# Patient Record
Sex: Female | Born: 1960
Health system: Southern US, Community
[De-identification: ages and names within clinical notes are randomized; demographics above are authoritative.]

## PROBLEM LIST (undated history)

## (undated) DIAGNOSIS — T7840XA Allergy, unspecified, initial encounter: Secondary | ICD-10-CM

## (undated) HISTORY — PX: TONSILLECTOMY: SUR1361

## (undated) HISTORY — DX: Allergy, unspecified, initial encounter: T78.40XA

---

## 2012-09-14 ENCOUNTER — Ambulatory Visit (INDEPENDENT_AMBULATORY_CARE_PROVIDER_SITE_OTHER): Payer: 59 | Admitting: Emergency Medicine

## 2012-09-14 VITALS — BP 112/69 | HR 64 | Temp 97.8°F | Resp 16 | Ht 67.0 in | Wt 159.0 lb

## 2012-09-14 DIAGNOSIS — R05 Cough: Secondary | ICD-10-CM

## 2012-09-14 DIAGNOSIS — J329 Chronic sinusitis, unspecified: Secondary | ICD-10-CM

## 2012-09-14 MED ORDER — FLUTICASONE PROPIONATE 50 MCG/ACT NA SUSP: 2.0000 | Freq: Every day | NASAL | Status: DC

## 2012-09-14 MED ORDER — BENZONATATE 100 MG PO CAPS: 100.0000 mg | ORAL_CAPSULE | Freq: Three times a day (TID) | ORAL | Status: DC | PRN

## 2012-09-14 MED ORDER — HYDROCOD POLST-CHLORPHEN POLST 10-8 MG/5ML PO LQCR: 5.0000 mL | Freq: Two times a day (BID) | ORAL | Status: DC | PRN

## 2012-09-14 MED ORDER — ALBUTEROL SULFATE (2.5 MG/3ML) 0.083% IN NEBU
2.5000 mg | INHALATION_SOLUTION | Freq: Once | RESPIRATORY_TRACT | Status: AC
  Administered 2012-09-14: 2.5 mg via RESPIRATORY_TRACT

## 2012-09-14 MED ORDER — AMOXICILLIN 500 MG PO CAPS: 500.0000 mg | ORAL_CAPSULE | Freq: Three times a day (TID) | ORAL | Status: DC

## 2012-09-14 NOTE — Patient Instructions (Addendum)

## 2012-09-14 NOTE — Progress Notes (Signed)
  Subjective:    Patient ID: Megan Conner, female    DOB: 02-16-1961, 52 y.o.   MRN: [id number]  HPI Patient comes into our office with complaints of her sinuses acting up for 6 days now tried otc medicine helped a little   yellowish green mucus  HX of bronchitis Deep cough   Review of Systems  Constitutional: Positive for fatigue. Negative for fever and chills.  HENT: Positive for congestion, sneezing, postnasal drip and sinus pressure.        Chest and nasal congestion  Respiratory: Positive for cough and shortness of breath. Negative for wheezing.   Neurological: Negative for dizziness and headaches.       Objective:   Physical Exam TMs are dull. Nose is congested. Throat is slightly red. Neck is supple chest was clear to auscultation and percussion. There is tenderness over the maxillary sinuses.        Assessment & Plan:  We'll treat with amoxicillin and Flonase. For her sinuses. For her cough she will use Tessalon Perles during the day and Tussionex at night. She did not improve with nebulizer treatment so we'll not do a albuterol inhaler

## 2013-03-26 ENCOUNTER — Ambulatory Visit (INDEPENDENT_AMBULATORY_CARE_PROVIDER_SITE_OTHER): Payer: 59 | Admitting: Family Medicine

## 2013-03-26 VITALS — BP 100/60 | HR 66 | Temp 98.2°F | Resp 16 | Ht 66.5 in | Wt 167.0 lb

## 2013-03-26 DIAGNOSIS — L500 Allergic urticaria: Secondary | ICD-10-CM

## 2013-03-26 MED ORDER — METHYLPREDNISOLONE SODIUM SUCC 125 MG IJ SOLR
125.0000 mg | Freq: Once | INTRAMUSCULAR | Status: AC
  Administered 2013-03-26: 125 mg via INTRAMUSCULAR

## 2013-03-26 MED ORDER — RANITIDINE HCL 150 MG PO TABS: 150.0000 mg | ORAL_TABLET | Freq: Two times a day (BID) | ORAL | Status: DC

## 2013-03-26 MED ORDER — PREDNISONE 20 MG PO TABS: 20.0000 mg | ORAL_TABLET | Freq: Every day | ORAL | Status: DC

## 2013-03-26 NOTE — Progress Notes (Signed)
Subjective: On Monday the patient broke out with times on her arms and buttocks. It subsided, but she's broken out again today, 3 days later. She does not have a history of hives. She does not know for certain he thinks she is exposed to that would've caused these except for she had a cup of Chi on both days. No oral lesions or respiratory compromise.  Objective: Pleasant lady in no major distress except she is scratching. Chest clear. Throat clear. Neck supple nontender. She has a rash on her upper arms, is scratching at her hands, and her flanks have hives on both sides. Down toward the buttocks.  Assessment: Urged.  Plan: Zyrtec Zantac Solu-Medrol 125 IM Continue the Zyrtec and Zantac at home and take tapered dose of prednisone.

## 2013-03-26 NOTE — Patient Instructions (Addendum)
Take ranitidine (Zantac) 150 mg twice daily  Take Zyrtec 10 mg(cetirizine) and milligrams once or twice daily  Take prednisone in tapered dose, 3 daily for 2 days, then 2 daily for 2 days, then one daily for 2 days  If any concern of respiratory involvement get checked properly. Got to the emergency room if necessary or call 911.  If continued recurrences please return  Hives Hives are itchy, red, swollen areas of the skin. They can vary in size and location on your body. Hives can come and go for hours or several days (acute hives) or for several weeks (chronic hives). Hives do not spread from person to person (noncontagious). They may get worse with scratching, exercise, and emotional stress. CAUSES   Allergic reaction to food, additives, or drugs.  Infections, including the common cold.  Illness, such as vasculitis, lupus, or thyroid disease.  Exposure to sunlight, heat, or cold.  Exercise.  Stress.  Contact with chemicals. SYMPTOMS   Red or white swollen patches on the skin. The patches may change size, shape, and location quickly and repeatedly.  Itching.  Swelling of the hands, feet, and face. This may occur if hives develop deeper in the skin. DIAGNOSIS  Your caregiver can usually tell what is wrong by performing a physical exam. Skin or blood tests may also be done to determine the cause of your hives. In some cases, the cause cannot be determined. TREATMENT  Mild cases usually get better with medicines such as antihistamines. Severe cases may require an emergency epinephrine injection. If the cause of your hives is known, treatment includes avoiding that trigger.  HOME CARE INSTRUCTIONS   Avoid causes that trigger your hives.  Take antihistamines as directed by your caregiver to reduce the severity of your hives. Non-sedating or low-sedating antihistamines are usually recommended. Do not drive while taking an antihistamine.  Take any other medicines prescribed for  itching as directed by your caregiver.  Wear loose-fitting clothing.  Keep all follow-up appointments as directed by your caregiver. SEEK MEDICAL CARE IF:   You have persistent or severe itching that is not relieved with medicine.  You have painful or swollen joints. SEEK IMMEDIATE MEDICAL CARE IF:   You have a fever.  Your tongue or lips are swollen.  You have trouble breathing or swallowing.  You feel tightness in the throat or chest.  You have abdominal pain. These problems may be the first sign of a life-threatening allergic reaction. Call your local emergency services (911 in [place] MAKE SURE YOU:   Understand these instructions.  Will watch your condition.  Will get help right away if you are not doing well or get worse. Document Released: 03/13/2005 Document Revised: 09/12/2011 Document Reviewed: 06/06/2011 [facility] Patient Information 2014 [place], [place].

## 2013-04-07 ENCOUNTER — Other Ambulatory Visit: Payer: Self-pay | Admitting: Family Medicine

## 2013-04-09 NOTE — Telephone Encounter (Signed)
Dr Alwyn RenHopper, do you want to RF for allergic urticaria or RTC?

## 2013-04-11 NOTE — Telephone Encounter (Signed)
Advised husband (on HIPPA) that RF done, but if still symptomatic pt should RTC. He stated that she doesn't have sxs every day, but if she is still having sxs after this RF he will have her RTC.

## 2013-04-11 NOTE — Telephone Encounter (Signed)
Call patient: Will refill, but is still having symptoms should return

## 2013-04-24 ENCOUNTER — Ambulatory Visit (INDEPENDENT_AMBULATORY_CARE_PROVIDER_SITE_OTHER): Payer: 59 | Admitting: Emergency Medicine

## 2013-04-24 VITALS — BP 106/72 | HR 78 | Temp 98.1°F | Resp 16 | Ht 65.75 in | Wt 165.0 lb

## 2013-04-24 DIAGNOSIS — R05 Cough: Secondary | ICD-10-CM

## 2013-04-24 DIAGNOSIS — R059 Cough, unspecified: Secondary | ICD-10-CM

## 2013-04-24 DIAGNOSIS — J019 Acute sinusitis, unspecified: Secondary | ICD-10-CM

## 2013-04-24 MED ORDER — BENZONATATE 100 MG PO CAPS: 100.0000 mg | ORAL_CAPSULE | Freq: Three times a day (TID) | ORAL | Status: DC | PRN

## 2013-04-24 MED ORDER — AMOXICILLIN 875 MG PO TABS: 875.0000 mg | ORAL_TABLET | Freq: Two times a day (BID) | ORAL | Status: DC

## 2013-04-24 NOTE — Patient Instructions (Signed)
Cough, Adult  A cough is a reflex that helps clear your throat and airways. It can help heal the body or may be a reaction to an irritated airway. A cough may only last 2 or 3 weeks (acute) or may last more than 8 weeks (chronic).  CAUSES Acute cough:  Viral or bacterial infections. Chronic cough:  Infections.  Allergies.  Asthma.  Post-nasal drip.  Smoking.  Heartburn or acid reflux.  Some medicines.  Chronic lung problems (COPD).  Cancer. SYMPTOMS   Cough.  Fever.  Chest pain.  Increased breathing rate.  High-pitched whistling sound when breathing (wheezing).  Colored mucus that you cough up (sputum). TREATMENT   A bacterial cough may be treated with antibiotic medicine.  A viral cough must run its course and will not respond to antibiotics.  Your caregiver may recommend other treatments if you have a chronic cough. HOME CARE INSTRUCTIONS   Only take over-the-counter or prescription medicines for pain, discomfort, or fever as directed by your caregiver. Use cough suppressants only as directed by your caregiver.  Use a cold steam vaporizer or humidifier in your bedroom or home to help loosen secretions.  Sleep in a semi-upright position if your cough is worse at night.  Rest as needed.  Stop smoking if you smoke. SEEK IMMEDIATE MEDICAL CARE IF:   You have pus in your sputum.  Your cough starts to worsen.  You cannot control your cough with suppressants and are losing sleep.  You begin coughing up blood.  You have difficulty breathing.  You develop pain which is getting worse or is uncontrolled with medicine.  You have a fever. MAKE SURE YOU:   Understand these instructions.  Will watch your condition.  Will get help right away if you are not doing well or get worse. Document Released: 09/09/2010 Document Revised: 06/05/2011 Document Reviewed: 09/09/2010 [employer] Patient Information 2014 [employer]. Sinusitis Sinusitis is redness,  soreness, and swelling (inflammation) of the paranasal sinuses. Paranasal sinuses are air pockets within the bones of your face (beneath the eyes, the middle of the forehead, or above the eyes). In healthy paranasal sinuses, mucus is able to drain out, and air is able to circulate through them by way of your nose. However, when your paranasal sinuses are inflamed, mucus and air can become trapped. This can allow bacteria and other germs to grow and cause infection. Sinusitis can develop quickly and last only a short time (acute) or continue over a long period (chronic). Sinusitis that lasts for more than 12 weeks is considered chronic.  CAUSES  Causes of sinusitis include:  Allergies.  Structural abnormalities, such as displacement of the cartilage that separates your nostrils (deviated septum), which can decrease the air flow through your nose and sinuses and affect sinus drainage.  Functional abnormalities, such as when the small hairs (cilia) that line your sinuses and help remove mucus do not work properly or are not present. SYMPTOMS  Symptoms of acute and chronic sinusitis are the same. The primary symptoms are pain and pressure around the affected sinuses. Other symptoms include:  Upper toothache.  Earache.  Headache.  Bad breath.  Decreased sense of smell and taste.  A cough, which worsens when you are lying flat.  Fatigue.  Fever.  Thick drainage from your nose, which often is green and may contain pus (purulent).  Swelling and warmth over the affected sinuses. DIAGNOSIS  Your caregiver will perform a physical exam. During the exam, your caregiver may:  Look in   your nose for signs of abnormal growths in your nostrils (nasal polyps).  Tap over the affected sinus to check for signs of infection.  View the inside of your sinuses (endoscopy) with a special imaging device with a light attached (endoscope), which is inserted into your sinuses. If your caregiver suspects  that you have chronic sinusitis, one or more of the following tests may be recommended:  Allergy tests.  Nasal culture A sample of mucus is taken from your nose and sent to a lab and screened for bacteria.  Nasal cytology A sample of mucus is taken from your nose and examined by your caregiver to determine if your sinusitis is related to an allergy. TREATMENT  Most cases of acute sinusitis are related to a viral infection and will resolve on their own within 10 days. Sometimes medicines are prescribed to help relieve symptoms (pain medicine, decongestants, nasal steroid sprays, or saline sprays).  However, for sinusitis related to a bacterial infection, your caregiver will prescribe antibiotic medicines. These are medicines that will help kill the bacteria causing the infection.  Rarely, sinusitis is caused by a fungal infection. In theses cases, your caregiver will prescribe antifungal medicine. For some cases of chronic sinusitis, surgery is needed. Generally, these are cases in which sinusitis recurs more than 3 times per year, despite other treatments. HOME CARE INSTRUCTIONS   Drink plenty of water. Water helps thin the mucus so your sinuses can drain more easily.  Use a humidifier.  Inhale steam 3 to 4 times a day (for example, sit in the bathroom with the shower running).  Apply a warm, moist washcloth to your face 3 to 4 times a day, or as directed by your caregiver.  Use saline nasal sprays to help moisten and clean your sinuses.  Take over-the-counter or prescription medicines for pain, discomfort, or fever only as directed by your caregiver. SEEK IMMEDIATE MEDICAL CARE IF:  You have increasing pain or severe headaches.  You have nausea, vomiting, or drowsiness.  You have swelling around your face.  You have vision problems.  You have a stiff neck.  You have difficulty breathing. MAKE SURE YOU:   Understand these instructions.  Will watch your condition.  Will get  help right away if you are not doing well or get worse. Document Released: 03/13/2005 Document Revised: 06/05/2011 Document Reviewed: 03/28/2011 [employer] Patient Information 2014 [employer].  

## 2013-04-24 NOTE — Progress Notes (Signed)
Subjective:    Patient ID: Megan Conner, female    DOB: 24-Jan-1961, 53 y.o.   MRN: [id number] This chart was scribed for Collene GobbleSteven A Gurvir Schrom, MD by Valera CastleSteven Perry, ED Scribe. This patient was seen in room 13 and the patient's care was started at 9:27 AM.  Chief Complaint  Patient presents with  . Sinus Congestion    X 1 week    HPI Megan Conner is a 53 y.o. female She presents with congestion and cough, productive of dark yellow/clear sputum, onset 1 week ago. She states her symptoms started with a sore throat. She also reports post-nasal drip. She reports at night when she lays down she can feel her ears plug up. She reports taking Flonase in the morning, with temporary relief. She denies being around sick contacts at work. She denies fever, body aches, sinus pressure/pain, and any other associated symptoms. She reports a h/o tonsillectomy. She denies h/o smoking. She reports Codeine causes her nausea, but denies any true allergic reaction.   PCP - GUEST, Loretha StaplerHRIS WARREN, MD  There are no active problems to display for this patient.  Past Medical History  Diagnosis Date  . Allergy    Past Surgical History  Procedure Laterality Date  . Tonsillectomy     Allergies  Allergen Reactions  . Codeine Nausea Only   Prior to Admission medications   Medication Sig Start Date End Date Taking? Authorizing Provider  fluticasone (FLONASE) 50 MCG/ACT nasal spray Place 2 sprays into the nose daily. 09/14/12  Yes Collene GobbleSteven A Tiffeny Minchew, MD  guaiFENesin (MUCINEX) 600 MG 12 hr tablet Take by mouth 2 (two) times daily.   Yes Historical Provider, MD  diphenhydrAMINE (BENADRYL) 25 MG tablet Take 25 mg by mouth every 6 (six) hours as needed.    Historical Provider, MD  predniSONE (DELTASONE) 20 MG tablet Take 1 tablet (20 mg total) by mouth daily with breakfast. 03/26/13   Peyton Najjaravid H Hopper, MD  ranitidine (ZANTAC) 150 MG tablet TAKE 1 TABLET (150 MG TOTAL) BY MOUTH 2 (TWO) TIMES DAILY. 04/07/13   Peyton Najjaravid H Hopper, MD     Review of Systems  Constitutional: Negative for fever.  HENT: Positive for congestion, postnasal drip and sore throat. Negative for ear pain and sinus pressure.   Respiratory: Positive for cough.   Musculoskeletal: Negative for arthralgias and myalgias.      Objective:   Physical Exam Nursing note and vitals reviewed. Constitutional: Pt is oriented to person, place, and time. Pt appears well-developed and well-nourished. No distress.  HENT: Significant nasal congestion. Purulent draining from right side of nose.   Head: Normocephalic and atraumatic.  Eyes: EOM are normal. Pupils are equal, round, and reactive to light.  Neck: Neck supple. No tracheal deviation present.  Cardiovascular: Normal rate, regular rhythm and normal heart sounds.  Exam reveals no gallop and no friction rub. No murmur heard. Pulmonary/Chest: Effort normal and breath sounds normal. No respiratory distress. Pt has no wheezes. Pt has no rales.  Abdominal: Musculoskeletal: Normal range of motion.  Neurological: Pt is alert and oriented to person, place, and time.  Skin: Skin is warm and dry.  Psychiatric: Pt has a normal mood and affect. Pt's behavior is normal.   BP 106/72  Pulse 78  Temp(Src) 98.1 F (36.7 C)  Resp 16  Ht 5' 5.75" (1.67 m)  Wt 165 lb (74.844 kg)  BMI 26.84 kg/m2  SpO2 97%     Assessment & Plan:   Patient has  a physical exam consistent with acute sinusitis. She also has a cough probably secondary to postnasal drip. We'll treat with amoxicillin and Tessalon Perles . She was 5 days into the illness so I did not do a flu swab

## 2013-10-02 ENCOUNTER — Other Ambulatory Visit: Payer: Self-pay | Admitting: Emergency Medicine

## 2016-10-14 ENCOUNTER — Ambulatory Visit (INDEPENDENT_AMBULATORY_CARE_PROVIDER_SITE_OTHER): Payer: 59

## 2016-10-14 ENCOUNTER — Encounter: Payer: Self-pay | Admitting: Physician Assistant

## 2016-10-14 ENCOUNTER — Ambulatory Visit (INDEPENDENT_AMBULATORY_CARE_PROVIDER_SITE_OTHER): Payer: 59 | Admitting: Physician Assistant

## 2016-10-14 VITALS — BP 114/76 | HR 70 | Temp 97.8°F | Resp 18 | Ht 66.69 in | Wt 183.0 lb

## 2016-10-14 DIAGNOSIS — G8929 Other chronic pain: Secondary | ICD-10-CM | POA: Diagnosis not present

## 2016-10-14 DIAGNOSIS — M25512 Pain in left shoulder: Secondary | ICD-10-CM | POA: Diagnosis not present

## 2016-10-14 DIAGNOSIS — G5601 Carpal tunnel syndrome, right upper limb: Secondary | ICD-10-CM | POA: Diagnosis not present

## 2016-10-14 LAB — GLUCOSE, POCT (MANUAL RESULT ENTRY): POC Glucose: 76 mg/dl (ref 70–99)

## 2016-10-14 MED ORDER — PREDNISONE 10 MG PO TABS: ORAL_TABLET | ORAL | 0 refills | Status: DC

## 2016-10-14 NOTE — Progress Notes (Signed)
Megan ShackletonVicky Conner Febus  MRN: [id number] DOB: 11/20/1960  Subjective:  Megan ShackletonVicky Conner Aoun is a 56 y.o. female who presents with left shoulder pain. The symptoms began one month ago. Aggravating factors: she works as a IT sales professionalfirefighter and uses it daily but cannot identify an initial event. Is constantly lifting heavy objects and carrying heavy packs. Pain is located around the acromioclavicular [facility](AC) joint. Discomfort is described as aching. Symptoms are exacerbated by repetitive movements, overhead movements and lying on the shoulder. Evaluation to date: none. Therapy to date includes: deep blue with moderate temporary relief.   Also have numbness/tingling in first three fingers of right hand x 2 months. It is most noticeable at night and when blow drying or typing at work. Denies acute injury, pain, and dropping objects.  Works part time in a job where she types a lot. 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 Has not tried anything for relief.  Review of Systems  Constitutional: Negative for chills, diaphoresis and fever.  Musculoskeletal: Negative for back pain, myalgias and neck pain.  Neurological: Negative for dizziness, facial asymmetry, speech difficulty, weakness, light-headedness and headaches.    There are no active problems to display for this patient.   Current Outpatient Prescriptions on File Prior to Visit  Medication Sig Dispense Refill  . fluticasone (FLONASE) 50 MCG/ACT nasal spray INHALE 2 SPRAYS INTO EACH NOSTRIL DAILY. 16 g 1  . amoxicillin (AMOXIL) 875 MG tablet Take 1 tablet (875 mg total) by mouth 2 (two) times daily. (Patient not taking: Reported on 10/14/2016) 20 tablet 0  . benzonatate (TESSALON) 100 MG capsule Take 1-2 capsules (100-200 mg total) by mouth 3 (three) times daily as needed for cough. (Patient not taking: Reported on 10/14/2016) 40 capsule 0  . diphenhydrAMINE (BENADRYL) 25 MG tablet Take 25 mg by mouth every 6 (six) hours as needed.      Marland Kitchen. guaiFENesin (MUCINEX) 600 MG 12 hr tablet Take by mouth 2 (two) times daily.    . ranitidine (ZANTAC) 150 MG tablet TAKE 1 TABLET (150 MG TOTAL) BY MOUTH 2 (TWO) TIMES DAILY. (Patient not taking: Reported on 10/14/2016) 30 tablet 0   No current facility-administered medications on file prior to visit.     Allergies  Allergen Reactions  . Codeine Nausea Only      Social History   Social History  . Marital status: Married    Spouse name: N/A  . Number of children: N/A  . Years of education: N/A   Occupational History  . Not on file.   Social History Main Topics  . Smoking status: Never Smoker  . Smokeless tobacco: Never Used  . Alcohol use 0.6 oz/week    1 Glasses of wine per week     Comment: social  . Drug use: Unknown  . Sexual activity: Yes   Other Topics Concern  . Not on file   Social History Narrative  . No narrative on file    Objective:  BP 114/76 (BP Location: Right Arm, Patient Position: Sitting, Cuff Size: Normal)   Pulse 70   Temp 97.8 F (36.6 C) (Oral)   Resp 18   Ht 5' 6.69" (1.694 m)   Wt 183 lb (83 kg)   SpO2 98%   BMI 28.93 kg/m   Physical Exam  Constitutional: She is oriented to person, place, and time and well-developed, well-nourished, and in no distress.  HENT:  Head: Normocephalic and atraumatic.  Eyes:  Conjunctivae are normal.  Neck: Normal range of motion.  Pulmonary/Chest: Effort normal.  Musculoskeletal:       Right shoulder: Normal.       Left shoulder: She exhibits tenderness (mild with palpation of AC joint  ) and crepitus ( with ROM exercises ). She exhibits normal range of motion, no bony tenderness, no swelling, normal pulse and normal strength.       Right wrist: She exhibits normal range of motion and no tenderness.       Cervical back: Normal.       Right hand: She exhibits normal capillary refill.  Positive Phalen's sign in right hand Negative Tinel's test of right hand  Negative Neer, Hawkins, and Drop Arm Test  of left shoulder.   Neurological: She is alert and oriented to person, place, and time. She has normal sensation. Gait normal.  Reflex Scores:      Tricep reflexes are 2+ on the right side and 2+ on the left side.      Bicep reflexes are 2+ on the right side and 2+ on the left side.      Brachioradialis reflexes are 2+ on the right side and 2+ on the left side. Skin: Skin is warm and dry.  Psychiatric: Affect normal.  Vitals reviewed.  Dg Shoulder Left  Result Date: 10/14/2016 CLINICAL DATA:  Left shoulder pain. EXAM: LEFT SHOULDER - 2+ VIEW COMPARISON:  None. FINDINGS: There is no evidence of fracture or dislocation. Minimal proliferative disease involving the distal acromion. The [facility] joint shows normal alignment. The glenohumeral joint shows no degenerative disease. No bony lesions identified. Soft tissues are unremarkable. IMPRESSION: Mild proliferative disease involving the left acromion. Electronically Signed   By: Irish Lack M.D.   On: 10/14/2016 10:11   Results for orders placed or performed in visit on 10/14/16 (from the past 24 hour(s))  POCT glucose (manual entry)     Status: None   Collection Time: 10/14/16 10:14 AM  Result Value Ref Range   POC Glucose 76 70 - 99 mg/dl   Assessment and Plan :  1. Chronic left shoulder pain Mild spurring of acromion noted on plain films. Pt encouraged to rest, ice, and perform exercises given today in office. Pt is going to be placed on prednisone for symptoms of carpal tunnel syndrome, which will also help with any underlying left shoulder inflammation. Pt instructed to return to clinic if symptoms worsen, do not improve, or as needed. Consider orthopedic referral if symptoms worsen or do not improve with current tx.  - DG Shoulder Left; Future - POCT glucose (manual entry)  2. Carpal tunnel syndrome of right wrist Hx and PE findings consistent with carpal tunnel syndrome. Will tx conservatively at this time. Given educational material on  carpal tunnel syndrome. Educated on common side effects of prednisone. Instructed to return to clinic if symptoms worsen, do not improve, or as needed. Consider neurology referral if symptoms worsen or do not improve with current tx.  - Splint wrist - predniSONE (DELTASONE) 10 MG tablet; Days 1-7: Take 2 tablets in the am with food. Days 7-14: Take 1 tablet in the am with food.  Dispense: 21 tablet; Refill: 0  Benjiman Core, PA-C  [facility] at [facility] 10/14/2016 5:45 PM

## 2016-10-14 NOTE — Patient Instructions (Addendum)
We are going to treat your underlying inflammation with oral prednisone. Prednisone is a steroid and can cause side effects such as headache, irritability, nausea, vomiting, increased heart rate, increased blood pressure, increased blood sugar, appetite changes, and insomnia. Please take tablets in the morning with a full meal to help decrease the chances of these side effects.    For shoulder pain, make sure you also ice and rest the area as much as you can in your free time. I will give you some shoulder exercises to do below.   For carpal tunnel, wear the night splint every night and avoid repetitive motions where you can.   For either issue, please return for follow up symptoms worsen, do not improve, or as needed    Carpal Tunnel Syndrome Carpal tunnel syndrome is a condition that causes pain in your hand and arm. The carpal tunnel is a narrow area that is on the palm side of your wrist. Repeated wrist motion or certain diseases may cause swelling in the tunnel. This swelling can pinch the main nerve in the wrist (median nerve). Follow these instructions at home: If you have a splint:  Wear it as told by your doctor. Remove it only as told by your doctor.  Loosen the splint if your fingers: ? Become numb and tingle. ? Turn blue and cold.  Keep the splint clean and dry. General instructions  Take over-the-counter and prescription medicines only as told by your doctor.  Rest your wrist from any activity that may be causing your pain. If needed, talk to your employer about changes that can be made in your work, such as getting a wrist pad to use while typing.  If directed, apply ice to the painful area: ? Put ice in a plastic bag. ? Place a towel between your skin and the bag. ? Leave the ice on for 20 minutes, 2-3 times per day.  Keep all follow-up visits as told by your doctor. This is important.  Do any exercises as told by your doctor, physical therapist, or occupational  therapist. Contact a doctor if:  You have new symptoms.  Medicine does not help your pain.  Your symptoms get worse. This information is not intended to replace advice given to you by your health care provider. Make sure you discuss any questions you have with your health care provider. Document Released: 03/02/2011 Document Revised: 08/19/2015 Document Reviewed: 07/29/2014 Elsevier Interactive Patient Education  2018 [employer]    Shoulder Impingement Syndrome Shoulder impingement syndrome is a condition that causes pain when connective tissues (tendons) surrounding the shoulder joint become pinched. These tendons are part of the group of muscles and tissues that help to stabilize the shoulder (rotator cuff). Beneath the rotator cuff is a fluid-filled sac (bursa) that allows the muscles and tendons to glide smoothly. The bursa may become swollen or irritated (bursitis). Bursitis, swelling in the rotator cuff tendons, or both conditions can decrease how much space is under a bone in the shoulder joint (acromion), resulting in impingement. What are the causes? Shoulder impingement syndrome can be caused by bursitis or swelling of the rotator cuff tendons, which may result from:  Repetitive overhead arm movements.  Falling onto the shoulder.  Weakness in the shoulder muscles.  What increases the risk? You may be more likely to develop this condition if you are an athlete who participates in:  Sports that involve throwing, such as baseball.  Tennis.  Swimming.  Volleyball.  Some people are also  more likely to develop impingement syndrome because of the shape of their acromion bone. What are the signs or symptoms? The main symptom of this condition is pain on the front or side of the shoulder. Pain may:  Get worse when lifting or raising the arm.  Get worse at night.  Wake you up from sleeping.  Feel sharp when the shoulder is moved, and then fade to an ache.  Other  signs and symptoms may include:  Tenderness.  Stiffness.  Inability to raise the arm above shoulder level or behind the body.  Weakness.  How is this diagnosed? This condition may be diagnosed based on:  Your symptoms.  Your medical history.  A physical exam.  Imaging tests, such as: ? X-rays. ? MRI. ? Ultrasound.  How is this treated? Treatment for this condition may include:  Resting your shoulder and avoiding all activities that cause pain or put stress on the shoulder.  Icing your shoulder.  NSAIDs to help reduce pain and swelling.  One or more injections of medicines to numb the area and reduce inflammation.  Physical therapy.  Surgery. This may be needed if nonsurgical treatments have not helped. Surgery may involve repairing the rotator cuff, reshaping the acromion, or removing the bursa.  Follow these instructions at home: Managing pain, stiffness, and swelling  If directed, apply ice to the injured area. ? Put ice in a plastic bag. ? Place a towel between your skin and the bag. ? Leave the ice on for 20 minutes, 2-3 times a day. Activity  Rest and return to your normal activities as told by your health care provider. Ask your health care provider what activities are safe for you.  Do exercises as told by your health care provider. General instructions  Do not use any tobacco products, including cigarettes, chewing tobacco, or e-cigarettes. Tobacco can delay healing. If you need help quitting, ask your health care provider.  Ask your health care provider when it is safe for you to drive.  Take over-the-counter and prescription medicines only as told by your health care provider.  Keep all follow-up visits as told by your health care provider. This is important. How is this prevented?  Give your body time to rest between periods of activity.  Be safe and responsible while being active to avoid falls.  Maintain physical fitness, including  strength and flexibility. Contact a health care provider if:  Your symptoms have not improved after 1-2 months of treatment and rest.  You cannot lift your arm away from your body. This information is not intended to replace advice given to you by your health care provider. Make sure you discuss any questions you have with your health care provider. Document Released: 03/13/2005 Document Revised: 11/18/2015 Document Reviewed: 02/13/2015 Elsevier Interactive Patient Education  2018 [employer]  Shoulder Impingement Syndrome Rehab Ask your health care provider which exercises are safe for you. Do exercises exactly as told by your health care provider and adjust them as directed. It is normal to feel mild stretching, pulling, tightness, or discomfort as you do these exercises, but you should stop right away if you feel sudden pain or your pain gets worse.Do not begin these exercises until told by your health care provider. Stretching and range of motion exercise This exercise warms up your muscles and joints and improves the movement and flexibility of your shoulder. This exercise also helps to relieve pain and stiffness. Exercise A: Passive horizontal adduction  1. Sit or stand  and pull your left / right elbow across your chest, toward your other shoulder. Stop when you feel a gentle stretch in the back of your shoulder and upper arm. ? Keep your arm at shoulder height. ? Keep your arm as close to your body as you comfortably can. 2. Hold for __________ seconds. 3. Slowly return to the starting position. Repeat __________ times. Complete this exercise __________ times a day. Strengthening exercises These exercises build strength and endurance in your shoulder. Endurance is the ability to use your muscles for a long time, even after they get tired. Exercise B: External rotation, isometric 1. Stand or sit in a doorway, facing the door frame. 2. Bend your left / right elbow and place the  back of your wrist against the door frame. Only your wrist should be touching the frame. Keep your upper arm at your side. 3. Gently press your wrist against the door frame, as if you are trying to push your arm away from your abdomen. ? Avoid shrugging your shoulder while you press your hand against the door frame. Keep your shoulder blade tucked down toward the middle of your back. 4. Hold for __________ seconds. 5. Slowly release the tension, and relax your muscles completely before you do the exercise again. Repeat __________ times. Complete this exercise __________ times a day. Exercise C: Internal rotation, isometric  1. Stand or sit in a doorway, facing the door frame. 2. Bend your left / right elbow and place the inside of your wrist against the door frame. Only your wrist should be touching the frame. Keep your upper arm at your side. 3. Gently press your wrist against the door frame, as if you are trying to push your arm toward your abdomen. ? Avoid shrugging your shoulder while you press your hand against the door frame. Keep your shoulder blade tucked down toward the middle of your back. 4. Hold for __________ seconds. 5. Slowly release the tension, and relax your muscles completely before you do the exercise again. Repeat __________ times. Complete this exercise __________ times a day. Exercise D: Scapular protraction, supine  1. Lie on your back on a firm surface. Hold a __________ weight in your left / right hand. 2. Raise your left / right arm straight into the air so your hand is directly above your shoulder joint. 3. Push the weight into the air so your shoulder lifts off of the surface that you are lying on. Do not move your head, neck, or back. 4. Hold for __________ seconds. 5. Slowly return to the starting position. Let your muscles relax completely before you repeat this exercise. Repeat __________ times. Complete this exercise __________ times a day. Exercise E:  Scapular retraction  1. Sit in a stable chair without armrests, or stand. 2. Secure an exercise band to a stable object in front of you so the band is at shoulder height. 3. Hold one end of the exercise band in each hand. Your palms should face down. 4. Squeeze your shoulder blades together and move your elbows slightly behind you. Do not shrug your shoulders while you do this. 5. Hold for __________ seconds. 6. Slowly return to the starting position. Repeat __________ times. Complete this exercise __________ times a day. Exercise F: Shoulder extension  1. Sit in a stable chair without armrests, or stand. 2. Secure an exercise band to a stable object in front of you where the band is above shoulder height. 3. Hold one end of the exercise  band in each hand. 4. Straighten your elbows and lift your hands up to shoulder height. 5. Squeeze your shoulder blades together and pull your hands down to the sides of your thighs. Stop when your hands are straight down by your sides. Do not let your hands go behind your body. 6. Hold for __________ seconds. 7. Slowly return to the starting position. Repeat __________ times. Complete this exercise __________ times a day. This information is not intended to replace advice given to you by your health care provider. Make sure you discuss any questions you have with your health care provider. Document Released: 03/13/2005 Document Revised: 11/18/2015 Document Reviewed: 02/13/2015 Elsevier Interactive Patient Education  2018 [employer]. Shoulder Exercises Ask your health care provider which exercises are safe for you. Do exercises exactly as told by your health care provider and adjust them as directed. It is normal to feel mild stretching, pulling, tightness, or discomfort as you do these exercises, but you should stop right away if you feel sudden pain or your pain gets worse.Do not begin these exercises until told by your health care provider. RANGE OF  MOTION EXERCISES These exercises warm up your muscles and joints and improve the movement and flexibility of your shoulder. These exercises also help to relieve pain, numbness, and tingling. These exercises involve stretching your injured shoulder directly. Exercise A: Pendulum  4. Stand near a wall or a surface that you can hold onto for balance. 5. Bend at the waist and let your left / right arm hang straight down. Use your other arm to support you. Keep your back straight and do not lock your knees. 6. Relax your left / right arm and shoulder muscles, and move your hips and your trunk so your left / right arm swings freely. Your arm should swing because of the motion of your body, not because you are using your arm or shoulder muscles. 7. Keep moving your body so your arm swings in the following directions, as told by your health care provider: ? Side to side. ? Forward and backward. ? In clockwise and counterclockwise circles. 8. Continue each motion for __________ seconds, or for as long as told by your health care provider. 9. Slowly return to the starting position. Repeat __________ times. Complete this exercise __________ times a day. Exercise B:Flexion, Standing  6. Stand and hold a broomstick, a cane, or a similar object. Place your hands a little more than shoulder-width apart on the object. Your left / right hand should be palm-up, and your other hand should be palm-down. 7. Keep your elbow straight and keep your shoulder muscles relaxed. Push the stick down with your healthy arm to raise your left / right arm in front of your body, and then over your head until you feel a stretch in your shoulder. ? Avoid shrugging your shoulder while you raise your arm. Keep your shoulder blade tucked down toward the middle of your back. 8. Hold for __________ seconds. 9. Slowly return to the starting position. Repeat __________ times. Complete this exercise __________ times a day. Exercise C:  Abduction, Standing 6. Stand and hold a broomstick, a cane, or a similar object. Place your hands a little more than shoulder-width apart on the object. Your left / right hand should be palm-up, and your other hand should be palm-down. 7. While keeping your elbow straight and your shoulder muscles relaxed, push the stick across your body toward your left / right side. Raise your left / right  arm to the side of your body and then over your head until you feel a stretch in your shoulder. ? Do not raise your arm above shoulder height, unless your health care provider tells you to do that. ? Avoid shrugging your shoulder while you raise your arm. Keep your shoulder blade tucked down toward the middle of your back. 8. Hold for __________ seconds. 9. Slowly return to the starting position. Repeat __________ times. Complete this exercise __________ times a day. Exercise D:Internal Rotation  1. Place your left / right hand behind your back, palm-up. 2. Use your other hand to dangle an exercise band, a towel, or a similar object over your shoulder. Grasp the band with your left / right hand so you are holding onto both ends. 3. Gently pull up on the band until you feel a stretch in the front of your left / right shoulder. ? Avoid shrugging your shoulder while you raise your arm. Keep your shoulder blade tucked down toward the middle of your back. 4. Hold for __________ seconds. 5. Release the stretch by letting go of the band and lowering your hands. Repeat __________ times. Complete this exercise __________ times a day. STRETCHING EXERCISES These exercises warm up your muscles and joints and improve the movement and flexibility of your shoulder. These exercises also help to relieve pain, numbness, and tingling. These exercises are done using your healthy shoulder to help stretch the muscles of your injured shoulder. Exercise E: [employer] officer, political party (External Rotation and Abduction)  1. Stand in a doorway  with one of your feet slightly in front of the other. This is called a staggered stance. If you cannot reach your forearms to the door frame, stand facing a corner of a room. 2. Choose one of the following positions as told by your health care provider: ? Place your hands and forearms on the door frame above your head. ? Place your hands and forearms on the door frame at the height of your head. ? Place your hands on the door frame at the height of your elbows. 3. Slowly move your weight onto your front foot until you feel a stretch across your chest and in the front of your shoulders. Keep your head and chest upright and keep your abdominal muscles tight. 4. Hold for __________ seconds. 5. To release the stretch, shift your weight to your back foot. Repeat __________ times. Complete this stretch __________ times a day. Exercise F:Extension, Standing 1. Stand and hold a broomstick, a cane, or a similar object behind your back. ? Your hands should be a little wider than shoulder-width apart. ? Your palms should face away from your back. 2. Keeping your elbows straight and keeping your shoulder muscles relaxed, move the stick away from your body until you feel a stretch in your shoulder. ? Avoid shrugging your shoulders while you move the stick. Keep your shoulder blade tucked down toward the middle of your back. 3. Hold for __________ seconds. 4. Slowly return to the starting position. Repeat __________ times. Complete this exercise __________ times a day. STRENGTHENING EXERCISES These exercises build strength and endurance in your shoulder. Endurance is the ability to use your muscles for a long time, even after they get tired. Exercise G:External Rotation  1. Sit in a stable chair without armrests. 2. Secure an exercise band at elbow height on your left / right side. 3. Place a soft object, such as a folded towel or a small pillow, between your left /  right upper arm and your body to move  your elbow a few inches away (about 10 cm) from your side. 4. Hold the end of the band so it is tight and there is no slack. 5. Keeping your elbow pressed against the soft object, move your left / right forearm out, away from your abdomen. Keep your body steady so only your forearm moves. 6. Hold for __________ seconds. 7. Slowly return to the starting position. Repeat __________ times. Complete this exercise __________ times a day. Exercise H:Shoulder Abduction  1. Sit in a stable chair without armrests, or stand. 2. Hold a __________ weight in your left / right hand, or hold an exercise band with both hands. 3. Start with your arms straight down and your left / right palm facing in, toward your body. 4. Slowly lift your left / right hand out to your side. Do not lift your hand above shoulder height unless your health care provider tells you that this is safe. ? Keep your arms straight. ? Avoid shrugging your shoulder while you do this movement. Keep your shoulder blade tucked down toward the middle of your back. 5. Hold for __________ seconds. 6. Slowly lower your arm, and return to the starting position. Repeat __________ times. Complete this exercise __________ times a day. Exercise I:Shoulder Extension 1. Sit in a stable chair without armrests, or stand. 2. Secure an exercise band to a stable object in front of you where it is at shoulder height. 3. Hold one end of the exercise band in each hand. Your palms should face each other. 4. Straighten your elbows and lift your hands up to shoulder height. 5. Step back, away from the secured end of the exercise band, until the band is tight and there is no slack. 6. Squeeze your shoulder blades together as you pull your hands down to the sides of your thighs. Stop when your hands are straight down by your sides. Do not let your hands go behind your body. 7. Hold for __________ seconds. 8. Slowly return to the starting position. Repeat  __________ times. Complete this exercise __________ times a day. Exercise J:Standing Shoulder Row 1. Sit in a stable chair without armrests, or stand. 2. Secure an exercise band to a stable object in front of you so it is at waist height. 3. Hold one end of the exercise band in each hand. Your palms should be in a thumbs-up position. 4. Bend each of your elbows to an "L" shape (about 90 degrees) and keep your upper arms at your sides. 5. Step back until the band is tight and there is no slack. 6. Slowly pull your elbows back behind you. 7. Hold for __________ seconds. 8. Slowly return to the starting position. Repeat __________ times. Complete this exercise __________ times a day. Exercise K:Shoulder Press-Ups  1. Sit in a stable chair that has armrests. Sit upright, with your feet flat on the floor. 2. Put your hands on the armrests so your elbows are bent and your fingers are pointing forward. Your hands should be about even with the sides of your body. 3. Push down on the armrests and use your arms to lift yourself off of the chair. Straighten your elbows and lift yourself up as much as you comfortably can. ? Move your shoulder blades down, and avoid letting your shoulders move up toward your ears. ? Keep your feet on the ground. As you get stronger, your feet should support less of your body weight  as you lift yourself up. 4. Hold for __________ seconds. 5. Slowly lower yourself back into the chair. Repeat __________ times. Complete this exercise __________ times a day. Exercise L: Wall Push-Ups  1. Stand so you are facing a stable wall. Your feet should be about one arm-length away from the wall. 2. Lean forward and place your palms on the wall at shoulder height. 3. Keep your feet flat on the floor as you bend your elbows and lean forward toward the wall. 4. Hold for __________ seconds. 5. Straighten your elbows to push yourself back to the starting position. Repeat __________  times. Complete this exercise __________ times a day. This information is not intended to replace advice given to you by your health care provider. Make sure you discuss any questions you have with your health care provider. Document Released: 01/25/2005 Document Revised: 12/06/2015 Document Reviewed: 11/22/2014 Elsevier Interactive Patient Education  2018 [employer].  IF you received an x-ray today, you will receive an invoice from [facility]. Please contact [facility] at [phone] with questions or concerns regarding your invoice.   IF you received labwork today, you will receive an invoice from [facility]. Please contact [facility] at [phone] with questions or concerns regarding your invoice.   Our billing staff will not be able to assist you with questions regarding bills from these companies.  You will be contacted with the lab results as soon as they are available. The fastest way to get your results is to activate your My Chart account. Instructions are located on the last page of this paperwork. If you have not heard from [place] regarding the results in 2 weeks, please contact this office.

## 2016-11-09 ENCOUNTER — Telehealth: Payer: Self-pay | Admitting: Physician Assistant

## 2016-11-09 DIAGNOSIS — G8929 Other chronic pain: Secondary | ICD-10-CM

## 2016-11-09 DIAGNOSIS — M25512 Pain in left shoulder: Principal | ICD-10-CM

## 2016-11-09 NOTE — Telephone Encounter (Signed)
Pt left message requesting a referral to [facility] to see Dr. Jerl Santosalldorf for her shoulder. Pt can be contacted at [phone].

## 2016-11-10 NOTE — Telephone Encounter (Signed)
Please call pt and let her know I have placed this referral.

## 2016-11-10 NOTE — Telephone Encounter (Signed)
Orders Placed This Encounter  °Procedures  ° Ambulatory referral to Orthopedic Surgery  °  Referral Priority:   Routine  °  Referral Type:   Surgical  °  Referral Reason:   Specialty Services Required  °  Requested Specialty:   Orthopedic Surgery  °  Number of Visits Requested:   1  °  °

## 2016-11-20 NOTE — Telephone Encounter (Signed)
Tried to follow-up with referral pt was unavailable, will try again later.

## 2016-11-23 NOTE — Telephone Encounter (Signed)
Left message referral was placed and if she has any questions, please call our office.

## 2017-10-11 DIAGNOSIS — Z1231 Encounter for screening mammogram for malignant neoplasm of breast: Secondary | ICD-10-CM | POA: Diagnosis not present

## 2018-02-05 DIAGNOSIS — J018 Other acute sinusitis: Secondary | ICD-10-CM | POA: Diagnosis not present

## 2018-02-05 DIAGNOSIS — H10021 Other mucopurulent conjunctivitis, right eye: Secondary | ICD-10-CM | POA: Diagnosis not present

## 2018-02-27 DIAGNOSIS — Z719 Counseling, unspecified: Secondary | ICD-10-CM | POA: Diagnosis not present

## 2018-03-05 DIAGNOSIS — Z719 Counseling, unspecified: Secondary | ICD-10-CM | POA: Diagnosis not present

## 2018-03-13 DIAGNOSIS — Z719 Counseling, unspecified: Secondary | ICD-10-CM | POA: Diagnosis not present

## 2018-03-14 DIAGNOSIS — Z719 Counseling, unspecified: Secondary | ICD-10-CM | POA: Diagnosis not present

## 2018-03-26 DIAGNOSIS — Z719 Counseling, unspecified: Secondary | ICD-10-CM | POA: Diagnosis not present

## 2018-04-01 ENCOUNTER — Ambulatory Visit: Payer: Self-pay

## 2018-04-01 ENCOUNTER — Other Ambulatory Visit: Payer: Self-pay | Admitting: Occupational Medicine

## 2018-04-01 DIAGNOSIS — M79641 Pain in right hand: Secondary | ICD-10-CM

## 2018-04-02 DIAGNOSIS — Z719 Counseling, unspecified: Secondary | ICD-10-CM | POA: Diagnosis not present

## 2018-04-09 DIAGNOSIS — Z719 Counseling, unspecified: Secondary | ICD-10-CM | POA: Diagnosis not present

## 2018-04-16 DIAGNOSIS — Z719 Counseling, unspecified: Secondary | ICD-10-CM | POA: Diagnosis not present

## 2018-04-23 DIAGNOSIS — Z719 Counseling, unspecified: Secondary | ICD-10-CM | POA: Diagnosis not present

## 2018-04-30 DIAGNOSIS — Z719 Counseling, unspecified: Secondary | ICD-10-CM | POA: Diagnosis not present

## 2018-05-07 DIAGNOSIS — Z719 Counseling, unspecified: Secondary | ICD-10-CM | POA: Diagnosis not present

## 2018-05-14 DIAGNOSIS — Z719 Counseling, unspecified: Secondary | ICD-10-CM | POA: Diagnosis not present

## 2018-05-28 DIAGNOSIS — Z719 Counseling, unspecified: Secondary | ICD-10-CM | POA: Diagnosis not present

## 2018-07-10 ENCOUNTER — Other Ambulatory Visit: Payer: Self-pay

## 2018-07-10 ENCOUNTER — Emergency Department (HOSPITAL_COMMUNITY): Payer: 59

## 2018-07-10 ENCOUNTER — Emergency Department (HOSPITAL_COMMUNITY)
Admission: EM | Admit: 2018-07-10 | Discharge: 2018-07-10 | Disposition: A | Discharge status: Home / Self Care | Payer: 59 | Attending: Emergency Medicine | Admitting: Emergency Medicine

## 2018-07-10 ENCOUNTER — Encounter (HOSPITAL_COMMUNITY): Payer: Self-pay

## 2018-07-10 DIAGNOSIS — I10 Essential (primary) hypertension: Secondary | ICD-10-CM | POA: Diagnosis not present

## 2018-07-10 DIAGNOSIS — R42 Dizziness and giddiness: Secondary | ICD-10-CM | POA: Diagnosis not present

## 2018-07-10 DIAGNOSIS — R112 Nausea with vomiting, unspecified: Secondary | ICD-10-CM | POA: Diagnosis not present

## 2018-07-10 LAB — CBC WITH DIFFERENTIAL/PLATELET
Abs Immature Granulocytes: 0.02 10*3/uL (ref 0.00–0.07)
Basophils Absolute: 0 10*3/uL (ref 0.0–0.1)
Basophils Relative: 1 %
Eosinophils Absolute: 0.1 10*3/uL (ref 0.0–0.5)
Eosinophils Relative: 2 %
HCT: 41.4 % (ref 36.0–46.0)
Hemoglobin: 13 g/dL (ref 12.0–15.0)
Immature Granulocytes: 0 %
Lymphocytes Relative: 24 %
Lymphs Abs: 1.4 10*3/uL (ref 0.7–4.0)
MCH: 28.3 pg (ref 26.0–34.0)
MCHC: 31.4 g/dL (ref 30.0–36.0)
MCV: 90.2 fL (ref 80.0–100.0)
Monocytes Absolute: 0.4 10*3/uL (ref 0.1–1.0)
Monocytes Relative: 7 %
Neutro Abs: 4 10*3/uL (ref 1.7–7.7)
Neutrophils Relative %: 66 %
Platelets: 177 10*3/uL (ref 150–400)
RBC: 4.59 MIL/uL (ref 3.87–5.11)
RDW: 12.9 % (ref 11.5–15.5)
WBC: 5.9 10*3/uL (ref 4.0–10.5)
nRBC: 0 % (ref 0.0–0.2)

## 2018-07-10 LAB — BASIC METABOLIC PANEL
Anion gap: 11 (ref 5–15)
BUN: 15 mg/dL (ref 6–20)
CO2: 21 mmol/L — ABNORMAL LOW (ref 22–32)
Calcium: 9.3 mg/dL (ref 8.9–10.3)
Chloride: 107 mmol/L (ref 98–111)
Creatinine, Ser: 0.67 mg/dL (ref 0.44–1.00)
GFR calc Af Amer: >60 mL/min (ref >60)
GFR calc non Af Amer: >60 mL/min (ref >60)
Glucose, Bld: 117 mg/dL — ABNORMAL HIGH (ref 70–99)
Potassium: 3.8 mmol/L (ref 3.5–5.1)
Sodium: 139 mmol/L (ref 135–145)

## 2018-07-10 MED ORDER — SODIUM CHLORIDE 0.9 % IV BOLUS
1000.0000 mL | Freq: Once | INTRAVENOUS | Status: AC
  Administered 2018-07-10: 1000 mL via INTRAVENOUS

## 2018-07-10 MED ORDER — MECLIZINE HCL 25 MG PO TABS: 25.0000 mg | ORAL_TABLET | Freq: Three times a day (TID) | ORAL | 0 refills | Status: DC | PRN

## 2018-07-10 MED ORDER — MECLIZINE HCL 25 MG PO TABS
25.0000 mg | ORAL_TABLET | Freq: Once | ORAL | Status: AC
  Administered 2018-07-10: 12:00:00 25 mg via ORAL
  Filled 2018-07-10: qty 1

## 2018-07-10 MED ORDER — ONDANSETRON 4 MG PO TBDP: 4.0000 mg | ORAL_TABLET | Freq: Three times a day (TID) | ORAL | 0 refills | Status: DC | PRN

## 2018-07-10 MED ORDER — ONDANSETRON HCL 4 MG/2ML IJ SOLN
4.0000 mg | Freq: Once | INTRAMUSCULAR | Status: AC
  Administered 2018-07-10: 4 mg via INTRAVENOUS
  Filled 2018-07-10: qty 2

## 2018-07-10 NOTE — ED Triage Notes (Signed)
Pt arrived via POV with c/o sudden onset of dizziness that began at 6a when patient rolled over in bed. Pt stated that she felt nauseous, drank some water and went back to bed; upon getting up at approx 8a pt vomited clear contents and felt like she was "going to pass out." Pt appears diaphoretic and states that pulse was irreg.

## 2018-07-10 NOTE — ED Provider Notes (Signed)
[facility] EMERGENCY DEPARTMENT Provider Note   CSN: [id number] Arrival date & time: 07/10/18  1021    History   Chief Complaint Chief Complaint  Patient presents with  . Dizziness    HPI Megan Conner is a 58 y.o. female presents today for evaluation of acute onset, intermittent dizziness with nausea and vomiting beginning at around 6:15 AM today.  She reports that as she rolled over in bed she felt suddenly "woozy "as though she was going to pass out.  She reports that since then she has had several episodes in which she has felt lightheaded, dizzy, at times a room spinning sensation.  These changes primarily occur with attempts to ambulate or rolling over in bed.  She reports that at rest she feels "okay but still nauseous ".  She has had 3-4 episodes of nonbloody nonbilious emesis since her symptoms began.  Denies abdominal pain, chest pain, shortness of breath, headache, vision changes, numbness or tingling in the extremities since her symptoms began.  She reports attempting to drink water because "when this happens it, I am usually dehydrated "with no significant improvement in her symptoms.  She states that her daughter came to her home to evaluate her and checked her blood pressure which was around 162/90 which is abnormal for her.  She reports her daughter also told her that she had an irregular heart rate.  She is a non-smoker, denies recreational drug use, drinks alcohol seldom.  She does report that she has a history of high cholesterol but is not currently on any medications for this.     The history is provided by the patient.    Past Medical History:  Diagnosis Date  . Allergy     There are no active problems to display for this patient.   Past Surgical History:  Procedure Laterality Date  . TONSILLECTOMY       OB History   No obstetric history on file.      Home Medications    Prior to Admission medications   Medication Sig Start Date  End Date Taking? Authorizing Provider  cetirizine-pseudoephedrine (ZYRTEC-D) 5-120 MG tablet Take by mouth.    [provider]  fluticasone (FLONASE) 50 MCG/ACT nasal spray INHALE 2 SPRAYS INTO EACH NOSTRIL DAILY.    Weber, Dema Severin, PA-C  meclizine (ANTIVERT) 25 MG tablet Take 1 tablet (25 mg total) by mouth 3 (three) times daily as needed for dizziness or nausea. 07/10/18   Mabry Santarelli A, PA-C  ondansetron (ZOFRAN ODT) 4 MG disintegrating tablet Take 1 tablet (4 mg total) by mouth every 8 (eight) hours as needed for nausea or vomiting. 07/10/18   Lilyan Prete A, PA-C  predniSONE (DELTASONE) 10 MG tablet Days 1-7: Take 2 tablets in the am with food. Days 7-14: Take 1 tablet in the am with food. 10/14/16   Magdalene River, PA-C    Family History Family History  Problem Relation Age of Onset  . Diabetes Mother   . Hypertension Mother   . Hyperlipidemia Mother   . Diabetes Father   . Hyperlipidemia Father   . Stroke Father   . Hypertension Father   . Heart disease Father     Social History Social History   Tobacco Use  . Smoking status: Never Smoker  . Smokeless tobacco: Never Used  Substance Use Topics  . Alcohol use: Yes    Alcohol/week: 1.0 standard drinks    Types: 1 Glasses of wine per week  Comment: social  . Drug use: Not on file     Allergies   Codeine   Review of Systems Review of Systems  Constitutional: Negative for chills and fever.  Eyes: Negative for photophobia and visual disturbance.  Respiratory: Negative for cough and shortness of breath.   Cardiovascular: Negative for chest pain.  Gastrointestinal: Positive for nausea and vomiting. Negative for abdominal pain, constipation and diarrhea.  Genitourinary: Negative for dysuria, frequency, hematuria and urgency.  Neurological: Positive for dizziness and light-headedness. Negative for syncope, weakness, numbness and headaches.  All other systems reviewed and are negative.    Physical Exam  Updated Vital Signs BP 121/68   Pulse (!) 51   Temp 97.6 F (36.4 C) (Oral)   Resp 12   Ht  (1.702 m)   Wt 82.6 kg   SpO2 98%   BMI 28.51 kg/m   Physical Exam Vitals signs and nursing note reviewed.  Constitutional:      General: She is not in acute distress.    Appearance: She is well-developed.  HENT:     Head: Normocephalic and atraumatic.  Eyes:     General:        Right eye: No discharge.        Left eye: No discharge.     Extraocular Movements: Extraocular movements intact.     Conjunctiva/sclera: Conjunctivae normal.     Pupils: Pupils are equal, round, and reactive to light.     Comments: No nystagmus  Neck:     Musculoskeletal: Normal range of motion and neck supple.     Vascular: No JVD.     Trachea: No tracheal deviation.  Cardiovascular:     Rate and Rhythm: Normal rate and regular rhythm.     Pulses: Normal pulses.     Heart sounds: Normal heart sounds.  Pulmonary:     Effort: Pulmonary effort is normal.     Breath sounds: Normal breath sounds.  Abdominal:     General: Abdomen is flat. There is no distension.     Palpations: Abdomen is soft.     Tenderness: There is no abdominal tenderness. There is no guarding or rebound.  Skin:    General: Skin is warm and dry.     Findings: No erythema.  Neurological:     General: No focal deficit present.     Mental Status: She is alert and oriented to person, place, and time.     GCS: GCS eye subscore is 4. GCS verbal subscore is 5. GCS motor subscore is 6.     Cranial Nerves: No cranial nerve deficit.     Sensory: No sensory deficit.     Motor: No weakness.     Coordination: Coordination normal.     Gait: Gait normal.     Comments: Mental Status:  Alert, thought content appropriate, able to give a coherent history. Speech fluent without evidence of aphasia. Able to follow 2 step commands without difficulty.  Cranial Nerves:  II:  Peripheral visual fields grossly normal, pupils equal, round, reactive to  light III,IV, VI: ptosis not present, extra-ocular motions intact bilaterally  V,VII: smile symmetric, facial light touch sensation equal VIII: hearing grossly normal to voice  X: uvula elevates symmetrically  XI: bilateral shoulder shrug symmetric and strong XII: midline tongue extension without fassiculations Motor:  Normal tone. 5/5 strength of BUE and BLE major muscle groups including strong and equal grip strength and dorsiflexion/plantar flexion, no pronator drift Sensory: light touch normal in  all extremities. Cerebellar: normal finger-to-nose with bilateral upper extremities, Romberg sign absent Gait: normal gait and balance, though patient endorses feeling unsteady. Able to walk on toes and heels with ease.    Psychiatric:        Behavior: Behavior normal.      ED Treatments / Results  Labs (all labs ordered are listed, but only abnormal results are displayed) Labs Reviewed  BASIC METABOLIC PANEL - Abnormal; Notable for the following components:      Result Value   CO2 21 (*)    Glucose, Bld 117 (*)    All other components within normal limits  CBC WITH DIFFERENTIAL/PLATELET    EKG EKG Interpretation  Date/Time:  Wednesday July 10 2018 10:43:01 EDT Ventricular Rate:  60 PR Interval:    QRS Duration: 105 QT Interval:  442 QTC Calculation: 442 R Axis:   77 Text Interpretation:  Sinus rhythm Prolonged PR interval Probable left atrial enlargement No previous ECGs available Confirmed by Vanetta MuldersZackowski, Scott [phone]([id number]) on 07/10/2018 2:44:17 PM   Radiology Ct Head Wo Contrast  Result Date: 07/10/2018 CLINICAL DATA:  Dizziness and hypertension beginning 0600 hours EXAM: CT HEAD WITHOUT CONTRAST TECHNIQUE: Contiguous axial images were obtained from the base of the skull through the vertex without intravenous contrast. COMPARISON:  None. FINDINGS: Brain: The brain shows a normal appearance without evidence of malformation, atrophy, old or acute small or large vessel infarction,  mass lesion, hemorrhage, hydrocephalus or extra-axial collection. Vascular: No hyperdense vessel. No evidence of atherosclerotic calcification. Skull: Normal.  No traumatic finding.  No focal bone lesion. Sinuses/Orbits: Sinuses are clear. Orbits appear normal. Mastoids are clear. Other: None significant IMPRESSION: Normal head CT Electronically Signed   By: Paulina FusiMark  Shogry M.D.   On: 07/10/2018 11:43   Mr Brain Wo Contrast (neuro Protocol)  Result Date: 07/10/2018 CLINICAL DATA:  Hypertension.  Dizziness and vomiting. EXAM: MRI HEAD WITHOUT CONTRAST TECHNIQUE: Multiplanar, multiecho pulse sequences of the brain and surrounding structures were obtained without intravenous contrast. COMPARISON:  Head CT same day FINDINGS: Brain: The brain has a normal appearance without evidence of malformation, atrophy, old or acute small or large vessel infarction, mass lesion, hemorrhage, hydrocephalus or extra-axial collection. Vascular: Major vessels at the base of the brain show flow. Venous sinuses appear patent. Skull and upper cervical spine: Normal. Sinuses/Orbits: Clear/normal. Other: None significant. IMPRESSION: Normal examination. No abnormality seen to explain the presenting symptoms. No visible sequela of hypertension. Electronically Signed   By: Paulina FusiMark  Shogry M.D.   On: 07/10/2018 14:30    Procedures Procedures (including critical care time)  Medications Ordered in ED Medications  meclizine (ANTIVERT) tablet 25 mg (25 mg Oral Given 07/10/18 1205)  ondansetron (ZOFRAN) injection 4 mg (4 mg Intravenous Given 07/10/18 1205)  sodium chloride 0.9 % bolus 1,000 mL (1,000 mLs Intravenous New Bag/Given 07/10/18 1205)     Initial Impression / Assessment and Plan / ED Course  I have reviewed the triage vital signs and the nursing notes.  Pertinent labs & imaging results that were available during my care of the patient were reviewed by me and considered in my medical decision making (see chart for details).         Patient presenting for evaluation of acute onset, episodic dizziness, nausea and vomiting.  Symptoms began this morning after rolling over in bed.  She is afebrile, vital signs are stable.  She is nontoxic in appearance.  No focal neurologic deficits, normal neurologic examination today on my assessment.  No  chest pain, shortness of breath, or abdominal pain.  Will obtain lab work to rule out anemia, metabolic derangements, and obtain head CT to rule out acute cranial abnormality.  She is not orthostatic.  Lab work reviewed by me shows no leukocytosis, no anemia, no metabolic derangements. EKG shows prolonged PR interval, no ischemic changes.  Head CT shows no acute intracranial abnormalities.  However, given she has a history of hypercholesterolemia not currently on any medications and with limited PCP follow-up, will obtain MRI to rule out posterior circulation or small vessel stroke.  She was given IV fluids and meclizine in the ED and on reevaluation reports she is feeling better.  MRI shows no evidence of acute intracranial abnormality.  On second reassessment she reports that she is feeling much better.  She is tolerating p.o. fluids without difficulty.  Suspect peripheral vertigo.  Will discharge with meclizine and Zofran as needed for symptoms.  Recommend follow-up with PCP for reevaluation of her symptoms.  Discussed strict ED return precautions. Pt verbalized understanding of and agreement with plan and is safe for discharge home at this time. Discussed with Dr. Deretha Emory who agrees with assessment and plan at this time.   Final Clinical Impressions(s) / ED Diagnoses   Final diagnoses:  Dizziness  Non-intractable vomiting with nausea, unspecified vomiting type    ED Discharge Orders         Ordered    ondansetron (ZOFRAN ODT) 4 MG disintegrating tablet  Every 8 hours PRN     07/10/18 1442    meclizine (ANTIVERT) 25 MG tablet  3 times daily PRN     07/10/18 1442            Jeanie Sewer, PA-C 07/10/18 1445    Vanetta Mulders, MD 07/13/18 1341

## 2018-07-10 NOTE — Discharge Instructions (Signed)
Your work-up today was reassuring that you are not having a stroke.  Start taking meclizine as needed for dizziness.  You can take this up to 3 times daily.  This medication may make you drowsy so do not drive, drink alcohol, or operate heavy machinery while taking this medication.  You can also cut these tablets in half.  Drink plenty water and get plenty of rest.  You can take Zofran as needed for nausea and vomiting.  Let this medicine dissolve under your tongue and wait around 10 minutes before having anything to eat or drink to give this medicine time to work.  Follow-up with your primary care physician for reevaluation of your dizziness and high cholesterol.  If your symptoms persist they may recommend physical therapy as an option.  Return to the emergency department if any concerning signs or symptoms develop such as weakness to one side of the body, fevers, persistent vomiting, severe headaches, etc.

## 2018-07-10 NOTE — ED Notes (Signed)
Patient returns from MRI

## 2020-03-25 IMAGING — CT CT HEAD WITHOUT CONTRAST
4 series · 15 of 47 positions shown, 17 images · non-contrast
Comparison: None.

CLINICAL DATA: Dizziness and hypertension beginning 4544 hours

EXAM:
CT HEAD WITHOUT CONTRAST
TECHNIQUE: Contiguous axial images were obtained from the base of the skull
through the vertex without intravenous contrast.

[Series 3: head without · axial · non-contrast · 0.43mm/px · z∈[-121,-1]mm · 7 of 32 slices shown, 9 images]
[im 4/32  brain]
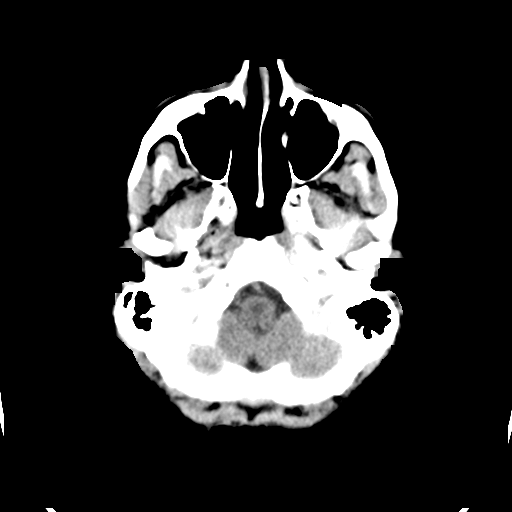
[im 4/32  bone]
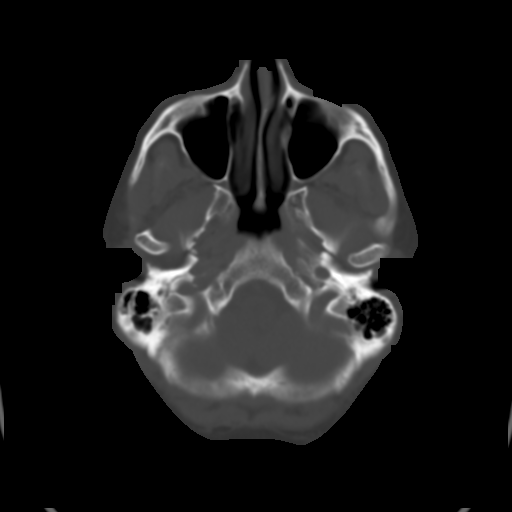
[im 8/32  brain]
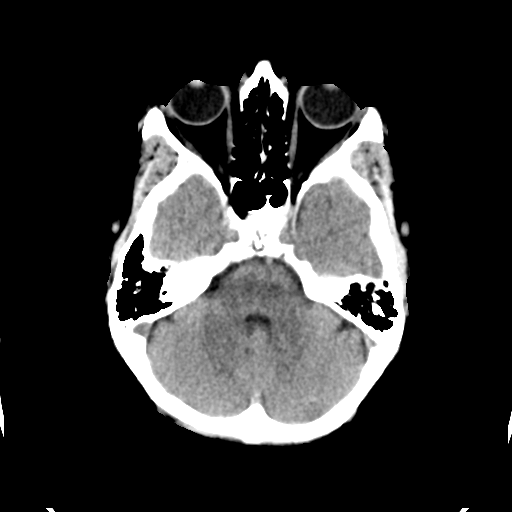
[im 12/32  brain]
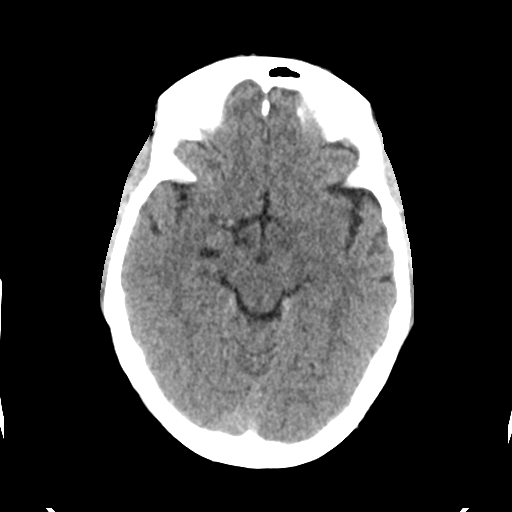
[im 16/32  brain]
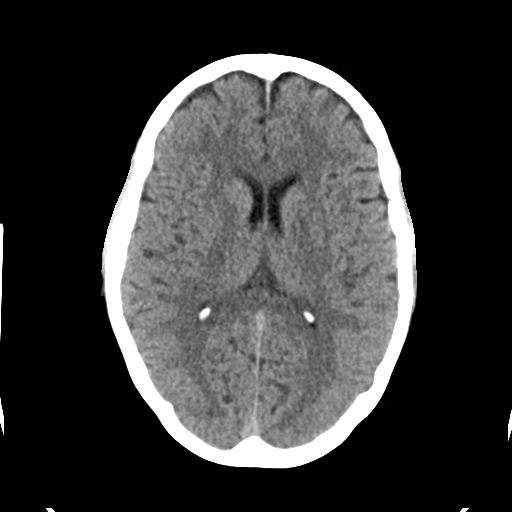
[im 20/32  brain]
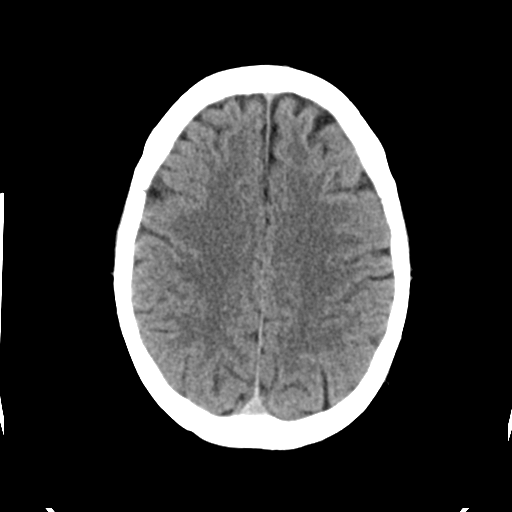
[im 20/32  bone]
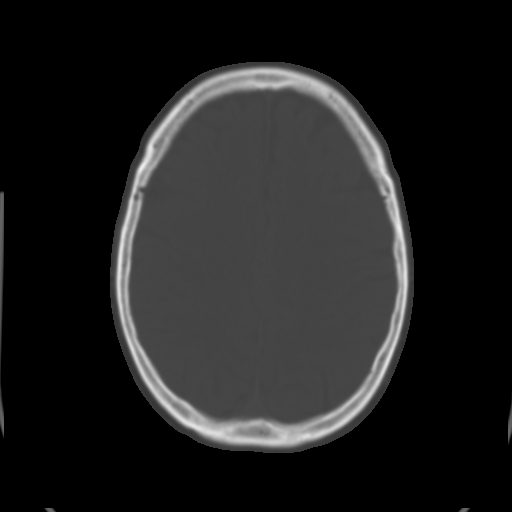
[im 24/32  brain]
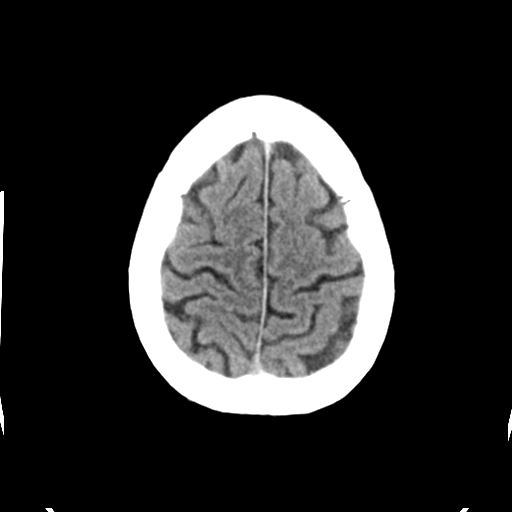
[im 28/32  brain]
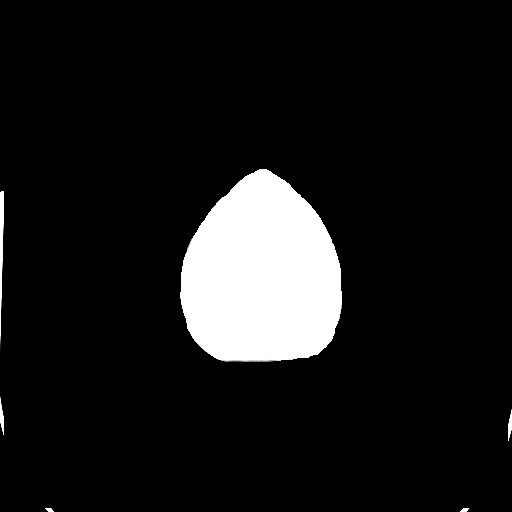

[Series 4: head bone · axial · 0.43mm/px · z∈[-122,-106]mm · 2 of 79 slices shown]
[im 8/79  bone]
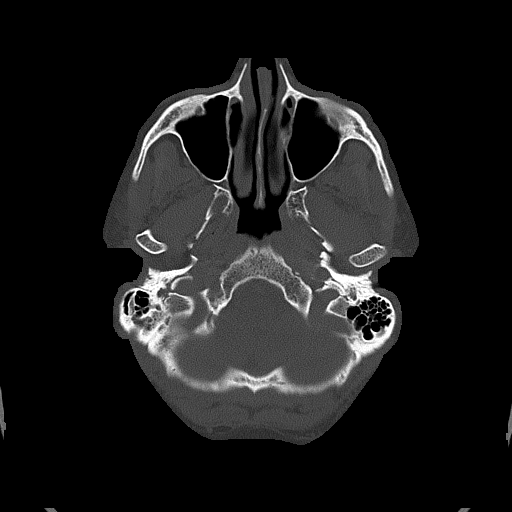
[im 16/79  bone]
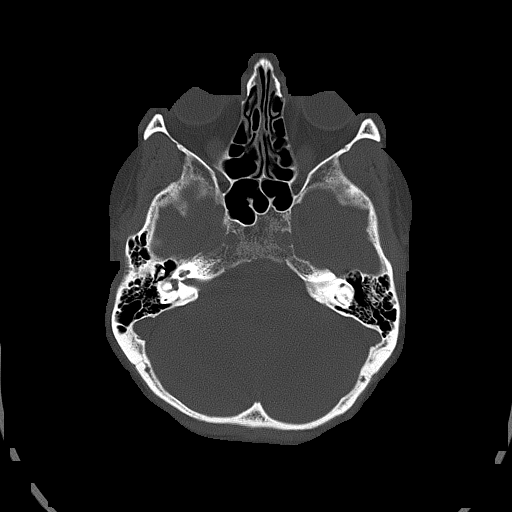

[Series 5: head without cor · coronal · non-contrast · 0.30mm/px · 3 of 67 slices shown]
[im 23/67  brain]
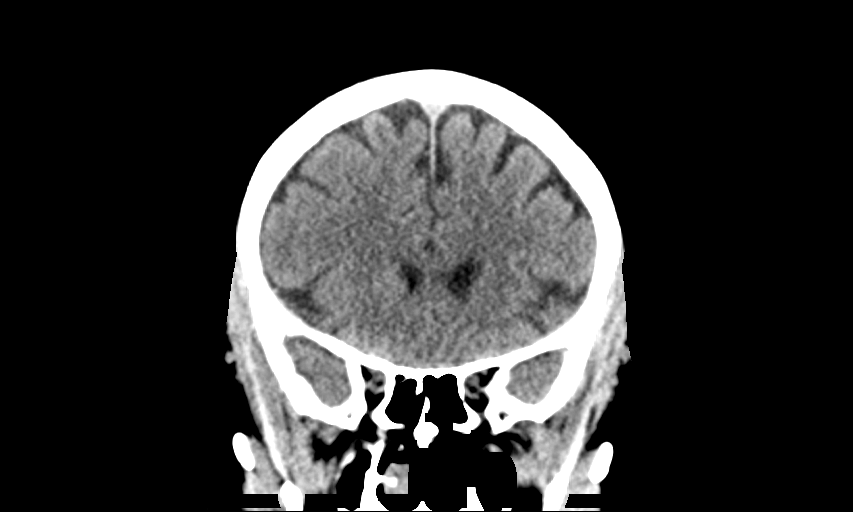
[im 30/67  brain]
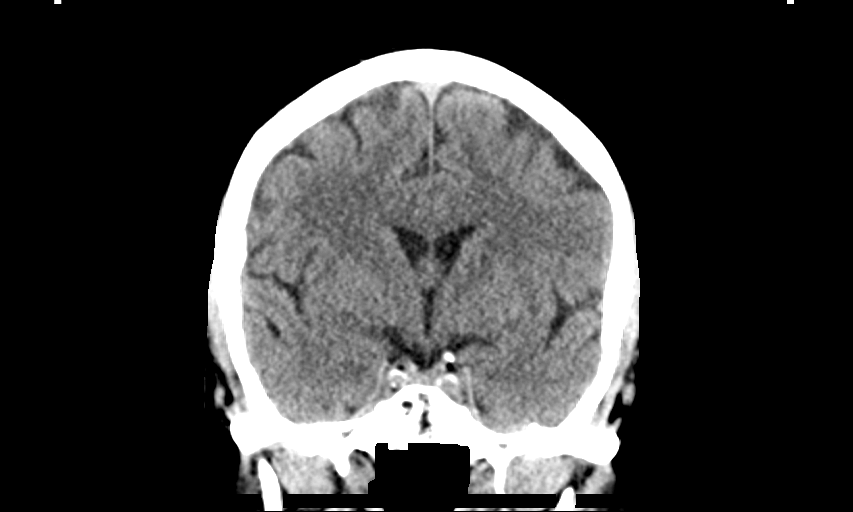
[im 37/67  brain]
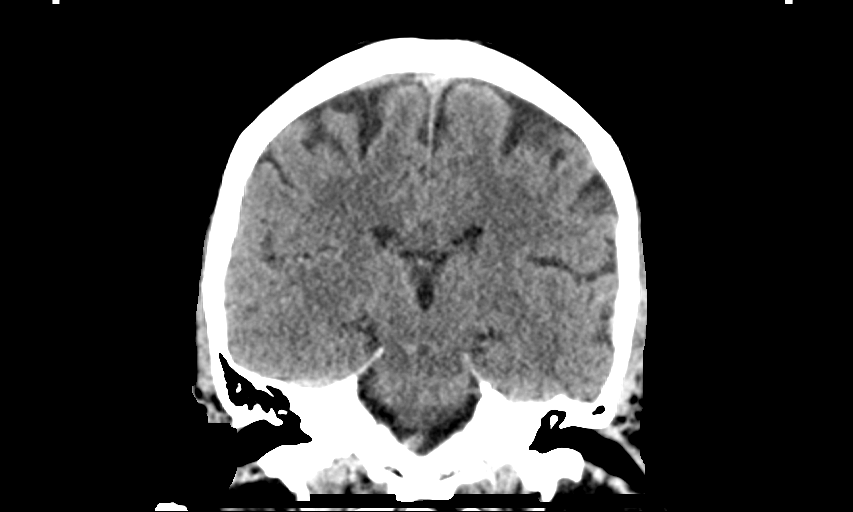

[Series 6: head without sag · sagittal · non-contrast · 0.36mm/px · 3 of 63 slices shown]
[im 21/63  brain]
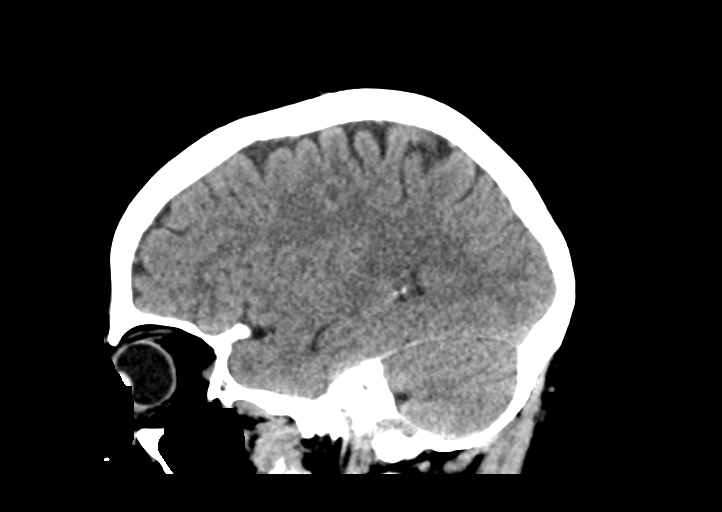
[im 32/63  brain]
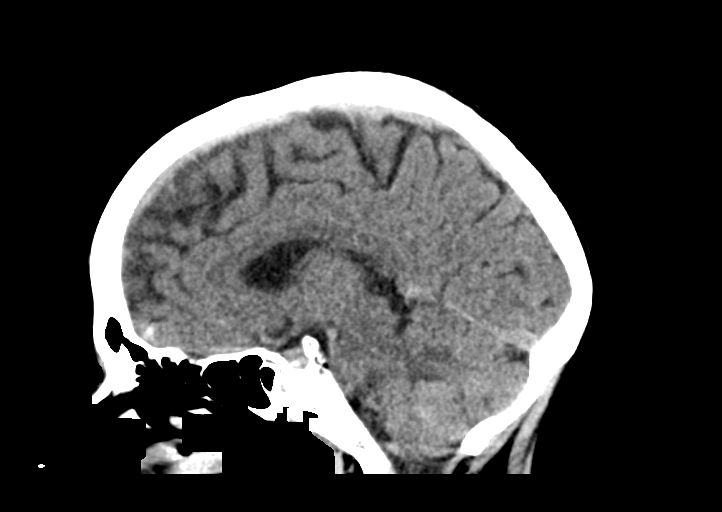
[im 42/63  brain]
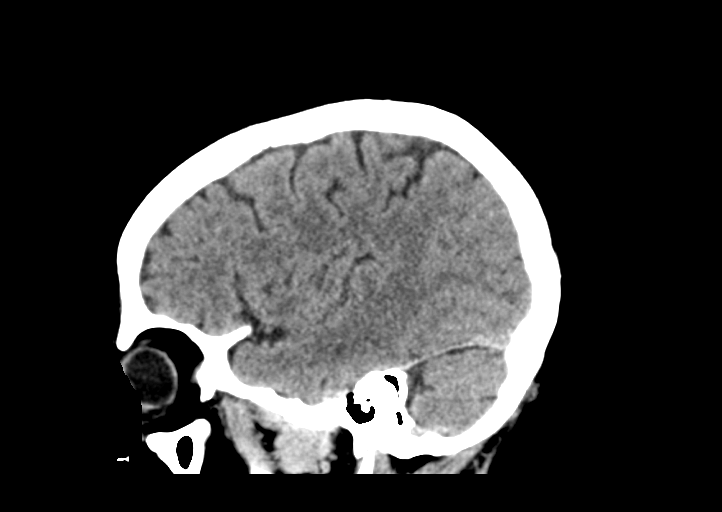

[15 of 47 positions shown; findings below may reference images not displayed]

FINDINGS: Brain: The brain shows a normal appearance without evidence of
malformation, atrophy, old or acute small or large vessel
infarction, mass lesion, hemorrhage, hydrocephalus or extra-axial
collection.

Vascular: No hyperdense vessel. No evidence of atherosclerotic
calcification.

Skull: Normal.  No traumatic finding.  No focal bone lesion.

Sinuses/Orbits: Sinuses are clear. Orbits appear normal. Mastoids
are clear.

Other: None significant
IMPRESSION: Normal head CT

## 2020-03-25 IMAGING — MR MRI HEAD WITHOUT CONTRAST
10 of 11 series · 43 of 48 positions shown · non-contrast
Comparison: Head CT same day

CLINICAL DATA: Hypertension.  Dizziness and vomiting.

EXAM:
MRI HEAD WITHOUT CONTRAST
TECHNIQUE: Multiplanar, multiecho pulse sequences of the brain and surrounding
structures were obtained without intravenous contrast.

[Series 5: DWI · axial · 3.0mm · 0.88mm/px · z∈[-63,+78]mm · 10 of 96 slices shown (1 of 4)]
[im 1/96]
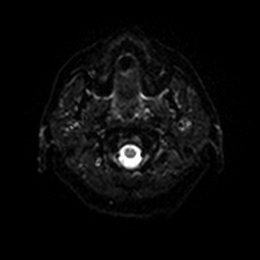
[im 11/96]
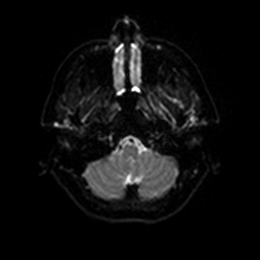
[im 22/96]
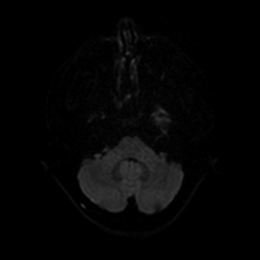
[im 32/96]
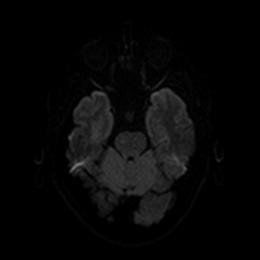
[im 43/96]
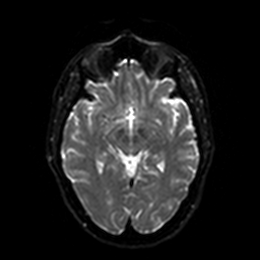
[im 53/96]
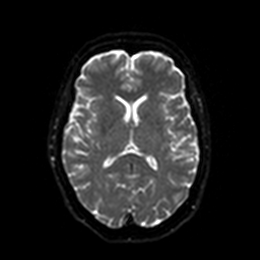
[im 64/96]
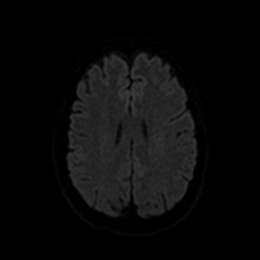
[im 74/96]
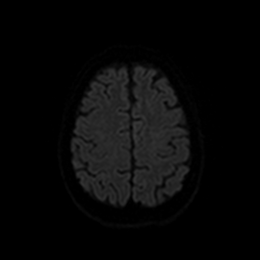
[im 85/96]
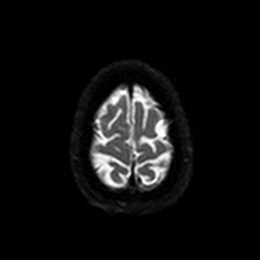
[im 96/96]
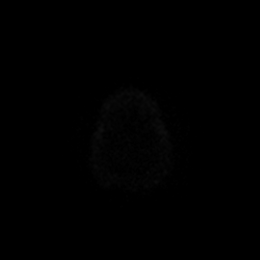

[Series 6: DWI · axial · 3.0mm · 0.88mm/px · z∈[-63,+78]mm · 5 of 48 slices shown (2 of 4)]
[im 1/48]
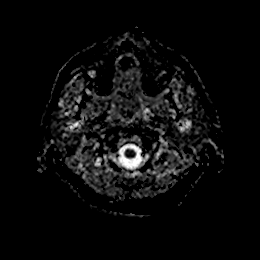
[im 12/48]
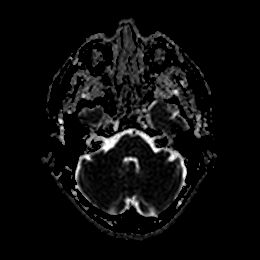
[im 24/48]
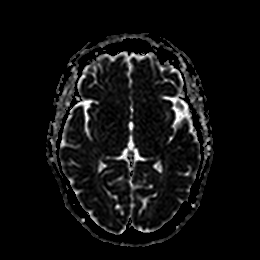
[im 36/48]
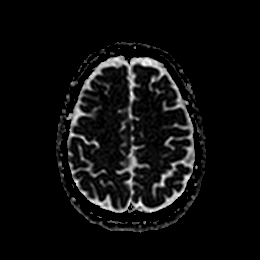
[im 48/48]
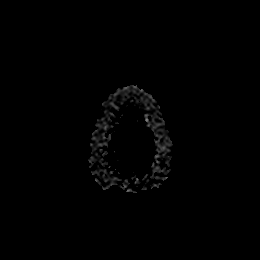

[Series 7: DWI · coronal · 4.0mm · 0.88mm/px · 6 of 64 slices shown (3 of 4)]
[im 1/64]
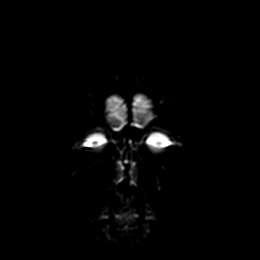
[im 13/64]
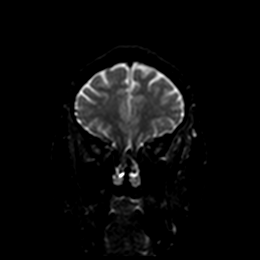
[im 26/64]
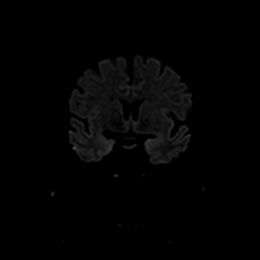
[im 38/64]
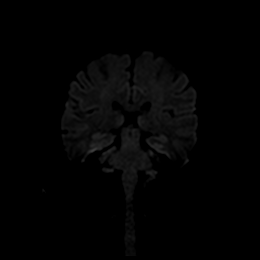
[im 51/64]
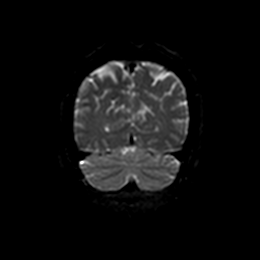
[im 64/64]
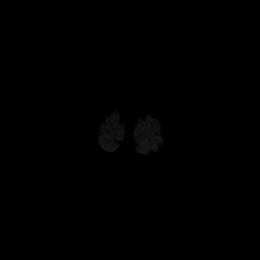

[Series 8: DWI · coronal · 4.0mm · 0.88mm/px · 3 of 32 slices shown (4 of 4)]
[im 1/32]
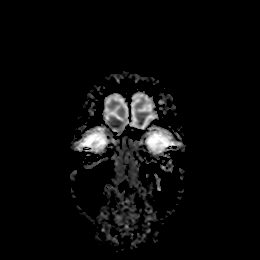
[im 16/32]
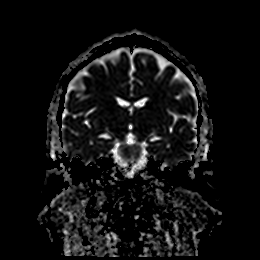
[im 32/32]
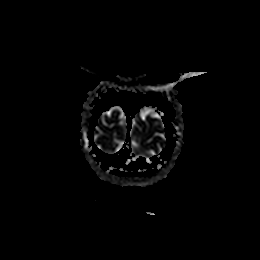

[Series 9: T1 · sagittal · 5.0mm · 0.75mm/px · 2 of 23 slices shown]
[im 1/23]
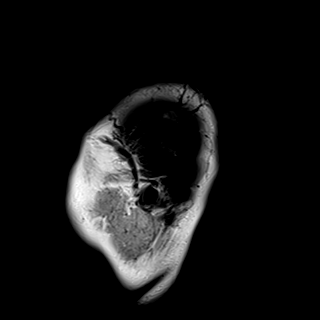
[im 23/23]
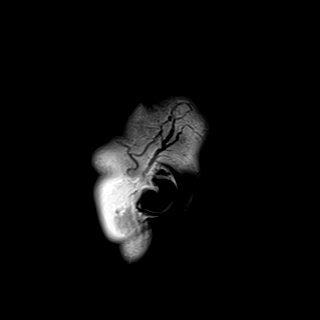

[Series 10: T2 · axial · 5.0mm · 0.72mm/px · z∈[-64,+80]mm · 2 of 25 slices shown (1 of 2)]
[im 1/25]
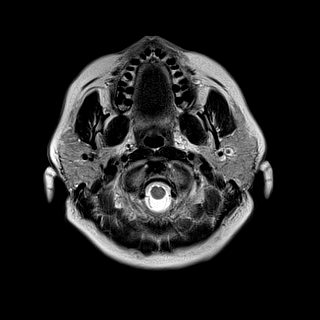
[im 25/25]
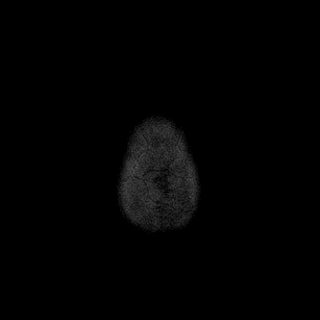

[Series 11: FLAIR · axial · 5.0mm · 0.45mm/px · z∈[-64,+80]mm · 2 of 25 slices shown]
[im 1/25]
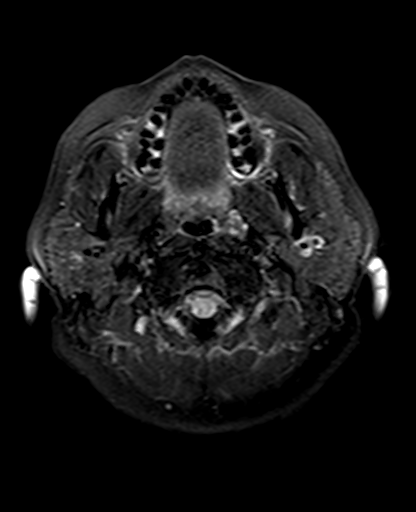
[im 25/25]
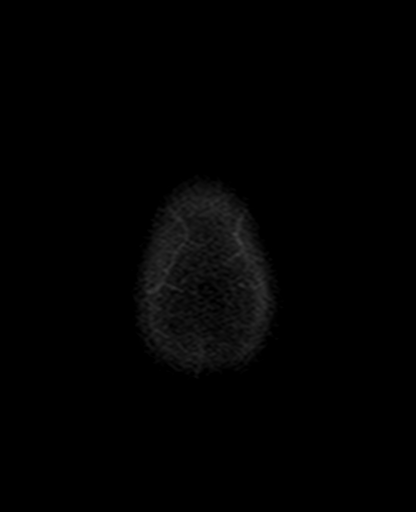

[Series 13: pha_images · axial · 3.0mm · 0.90mm/px · z∈[-74,+79]mm · 5 of 52 slices shown]
[im 1/52]
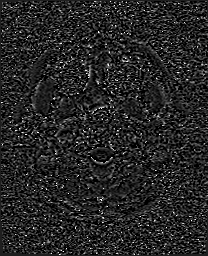
[im 13/52]
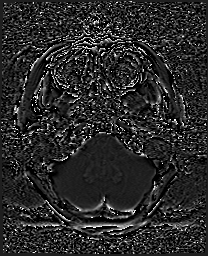
[im 26/52]
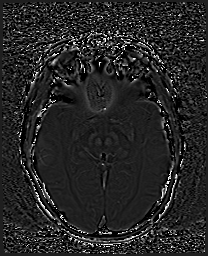
[im 39/52]
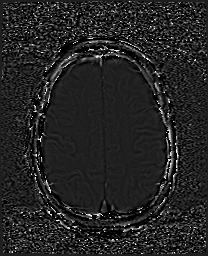
[im 52/52]
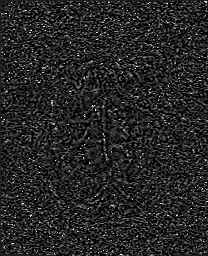

[Series 14: swi_images · axial · 3.0mm · 0.90mm/px · z∈[-74,+79]mm · 5 of 52 slices shown]
[im 1/52]
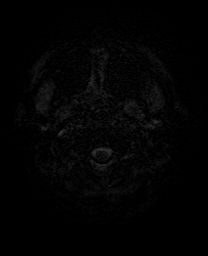
[im 13/52]
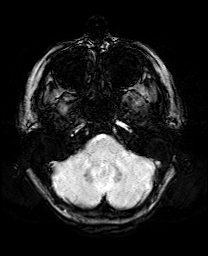
[im 26/52]
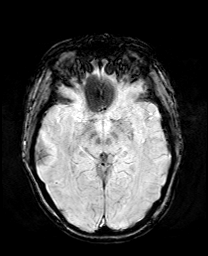
[im 39/52]
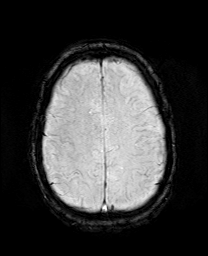
[im 52/52]
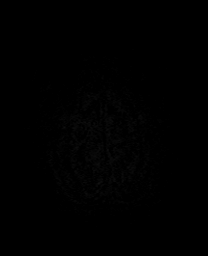

[Series 17: T2 · coronal · 5.0mm · 0.34mm/px · 3 of 29 slices shown (2 of 2)]
[im 1/29]
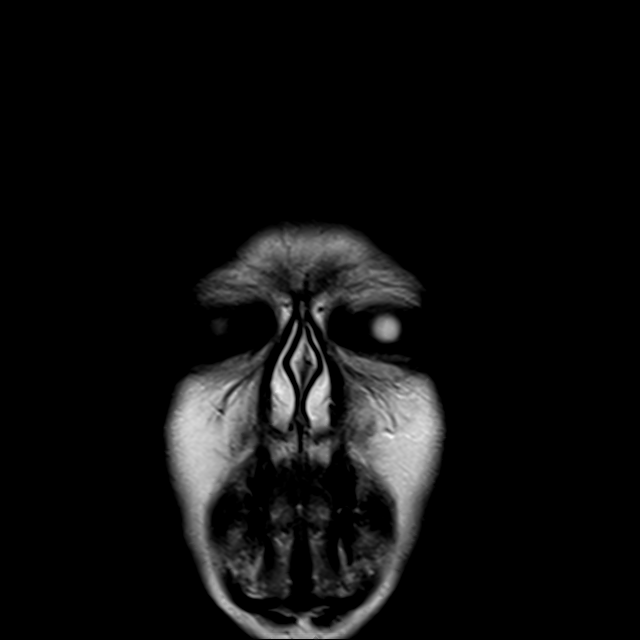
[im 15/29]
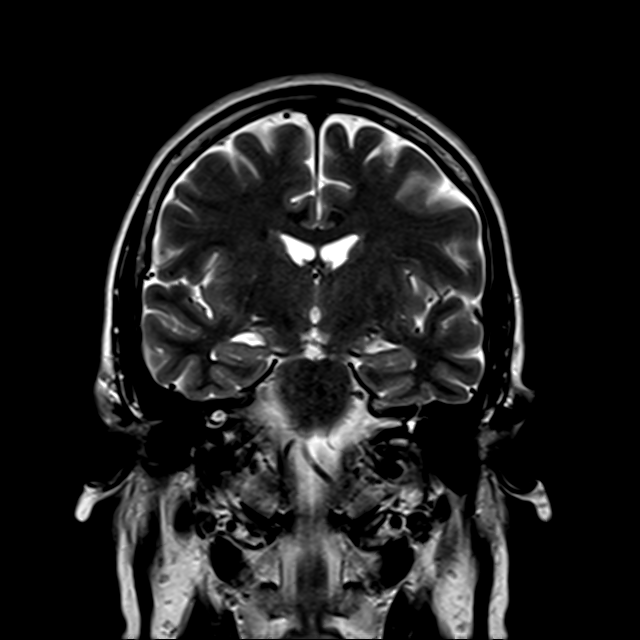
[im 29/29]
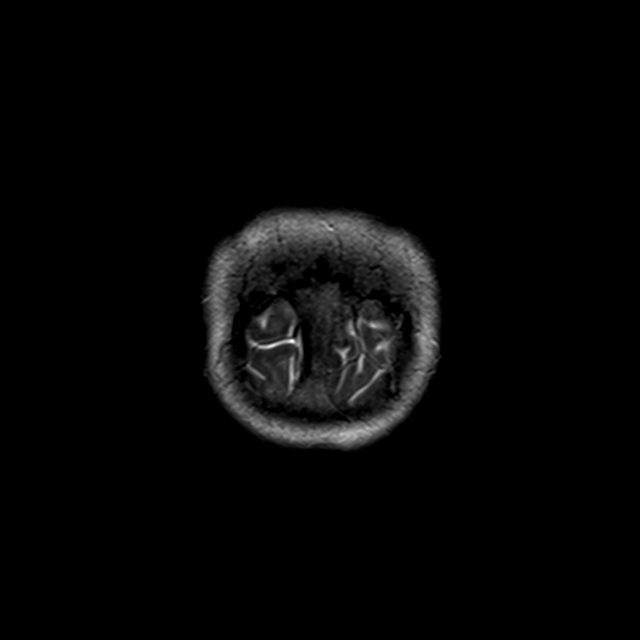

[43 of 48 positions shown; findings below may reference images not displayed]

FINDINGS: Brain: The brain has a normal appearance without evidence of
malformation, atrophy, old or acute small or large vessel
infarction, mass lesion, hemorrhage, hydrocephalus or extra-axial
collection.

Vascular: Major vessels at the base of the brain show flow. Venous
sinuses appear patent.

Skull and upper cervical spine: Normal.

Sinuses/Orbits: Clear/normal.

Other: None significant.
IMPRESSION: Normal examination. No abnormality seen to explain the presenting
symptoms. No visible sequela of hypertension.

## 2023-10-23 ENCOUNTER — Other Ambulatory Visit (HOSPITAL_BASED_OUTPATIENT_CLINIC_OR_DEPARTMENT_OTHER): Payer: Self-pay | Admitting: Family Medicine

## 2023-10-23 DIAGNOSIS — Z8249 Family history of ischemic heart disease and other diseases of the circulatory system: Secondary | ICD-10-CM

## 2023-10-26 ENCOUNTER — Ambulatory Visit (HOSPITAL_BASED_OUTPATIENT_CLINIC_OR_DEPARTMENT_OTHER)
Admission: RE | Admit: 2023-10-26 | Discharge: 2023-10-26 | Disposition: A | Discharge status: Home / Self Care | Source: Ambulatory Visit | Attending: Family Medicine | Admitting: Family Medicine

## 2023-10-26 DIAGNOSIS — Z8249 Family history of ischemic heart disease and other diseases of the circulatory system: Secondary | ICD-10-CM

## 2023-12-02 NOTE — Progress Notes (Unsigned)
 Electrophysiology Office Note:   Date:  12/04/2023  ID:  Megan Conner, DOB [birth year], MRN [id number]  Primary Cardiologist: Norleen CHRISTELLA Blare, MD Primary Heart Failure: None Electrophysiologist: Makailee Nudelman Lunger Norton, MD      History of Present Illness:   Megan Conner is a 63 y.o. female with h/o prediabetes, hyperlipidemia, atrial fibrillation seen today for  for Electrophysiology evaluation of atrial fibrillation at the request of Norleen Blare.    She was diagnosed with atrial fibrillation in April.  She has multiple episodes of atrial fibrillation per week.  She feels palpitations, fatigue, short of breath.  When she is in normal rhythm, she has no acute complaints and is able to do all of her daily activities.  She would prefer a rhythm control strategy and would prefer to avoid long-term antiarrhythmics.  She has no chest pain or shortness of breath when she is in normal rhythm.  Review of systems complete and found to be negative unless listed in HPI.   EP Information / Studies Reviewed:    EKG is ordered today. Personal review as below.  EKG Interpretation Date/Time:  Tuesday December 04 2023 11:09:01 EDT Ventricular Rate:  73 PR Interval:  222 QRS Duration:  86 QT Interval:  400 QTC Calculation: 440 R Axis:   94  Text Interpretation: Sinus rhythm with 1st degree A-V block Biatrial enlargement Rightward axis When compared with ECG of 10-Jul-2018 10:43, PREVIOUS ECG IS PRESENT Confirmed by Djimon Lundstrom ([id number]) on 12/04/2023 11:25:48 AM     Risk Assessment/Calculations:    CHA2DS2-VASc Score = 1   This indicates a 0.6% annual risk of stroke. The patient's score is based upon: CHF History: 0 HTN History: 0 Diabetes History: 0 Stroke History: 0 Vascular Disease History: 0 Age Score: 0 Gender Score: 1             Physical Exam:   VS:  BP 118/80 (BP Location: Right Arm, Patient Position: Sitting, Cuff Size: Normal)   Pulse 74   Ht 5' 7 (1.702 m)   Wt 164 lb (74.4  kg)   SpO2 96%   BMI 25.69 kg/m    Wt Readings from Last 3 Encounters:  12/04/23 164 lb (74.4 kg)  07/10/18 182 lb (82.6 kg)  10/14/16 183 lb (83 kg)     GEN: Well nourished, well developed in no acute distress NECK: No JVD; No carotid bruits CARDIAC: Regular rate and rhythm, no murmurs, rubs, gallops RESPIRATORY:  Clear to auscultation without rales, wheezing or rhonchi  ABDOMEN: Soft, non-tender, non-distended EXTREMITIES:  No edema; No deformity   ASSESSMENT AND PLAN:    1.  Paroxysmal atrial fibrillation: Has multiple episodes of atrial fibrillation a week.  She feels palpitations, with mild fatigue and shortness of breath.  She would prefer rhythm control and would like to avoid long-term antiarrhythmics.  Jacque Byron start propafenone  to 25 mg daily today.  As she would prefer to avoid this long-term, we Amarii Amy plan for ablation.  Risk, benefits, and alternatives to EP study and radiofrequency/pulse field ablation for afib were also discussed in detail today. These risks include but are not limited to stroke, bleeding, vascular damage, tamponade, perforation, damage to the esophagus, lungs, and other structures, pulmonary vein stenosis, worsening renal function, and death. The patient understands these risk and wishes to proceed.  We Alanmichael Barmore therefore proceed with catheter ablation at the next available time.  Carto, ICE, anesthesia are requested for the procedure.  This patient Desaray Marschner NOT require CT prior  to ablation  Follow up with [facility] as usual post procedure  Signed, Deasha Clendenin Gladis Norton, MD

## 2023-12-04 ENCOUNTER — Ambulatory Visit: Attending: Cardiology | Admitting: Cardiology

## 2023-12-04 ENCOUNTER — Encounter: Payer: Self-pay | Admitting: Cardiology

## 2023-12-04 VITALS — BP 118/80 | HR 74 | Ht 67.0 in | Wt 164.0 lb

## 2023-12-04 DIAGNOSIS — Z01812 Encounter for preprocedural laboratory examination: Secondary | ICD-10-CM | POA: Diagnosis not present

## 2023-12-04 DIAGNOSIS — I4891 Unspecified atrial fibrillation: Secondary | ICD-10-CM

## 2023-12-04 MED ORDER — PROPAFENONE HCL ER 225 MG PO CP12: 225.0000 mg | ORAL_CAPSULE | Freq: Two times a day (BID) | ORAL | 6 refills | Status: AC

## 2023-12-04 NOTE — Patient Instructions (Addendum)
 Medication Instructions:  Your physician has recommended you make the following change in your medication:  START Propafenone  225 mg twice a day  *If you need a refill on your cardiac medications before your next appointment, please call your pharmacy*   Lab Work: Pre procedure labs -- we will call you to schedule:  BMP & CBC  If you have a lab test that is abnormal and we need to change your treatment, we will call you to review the results -- otherwise no news is good news.    Testing/Procedures: Your physician has recommended that you have an ablation. Catheter ablation is a medical procedure used to treat some cardiac arrhythmias (irregular heartbeats). During catheter ablation, a long, thin, flexible tube is put into a blood vessel in your groin (upper thigh), or neck. This tube is called an ablation catheter. It is then guided to your heart through the blood vessel. Radio frequency waves destroy small areas of heart tissue where abnormal heartbeats may cause an arrhythmia to start.   Your ablation is scheduled for 02/08/2024. Please arrive at [facility] at 5:30 am.  We will call/send instructions at a later date.   Follow-Up: At [facility], you and your health needs are our priority.  As part of our continuing mission to provide you with exceptional heart care, we have created designated Provider Care Teams.  These Care Teams include your primary Cardiologist (physician) and Advanced Practice Providers (APPs -  Physician Assistants and Nurse Practitioners) who all work together to provide you with the care you need, when you need it.  Your next appointment:   1 month(s) after your ablation  The format for your next appointment:   In Person  Provider:   AFib clinic   Thank you for choosing [facility]!!   Maeola Domino, RN [phone]    Other Instructions   Cardiac Ablation Cardiac ablation is a procedure to destroy (ablate) some heart tissue that is  sending bad signals. These bad signals cause problems in heart rhythm. The heart has many areas that make these signals. If there are problems in these areas, they can make the heart beat in a way that is not normal. Destroying some tissues can help make the heart rhythm normal. Tell your doctor about: Any allergies you have. All medicines you are taking. These include vitamins, herbs, eye drops, creams, and over-the-counter medicines. Any problems you or family members have had with medicines that make you fall asleep (anesthetics). Any blood disorders you have. Any surgeries you have had. Any medical conditions you have, such as kidney failure. Whether you are pregnant or may be pregnant. What are the risks? This is a safe procedure. But problems may occur, including: Infection. Bruising and bleeding. Bleeding into the chest. Stroke or blood clots. Damage to nearby areas of your body. Allergies to medicines or dyes. The need for a pacemaker if the normal system is damaged. Failure of the procedure to treat the problem. What happens before the procedure? Medicines Ask your doctor about: Changing or stopping your normal medicines. This is important. Taking aspirin and ibuprofen. Do not take these medicines unless your doctor tells you to take them. Taking other medicines, vitamins, herbs, and supplements. General instructions Follow instructions from your doctor about what you cannot eat or drink. Plan to have someone take you home from the hospital or clinic. If you will be going home right after the procedure, plan to have someone with you for 24 hours.  Ask your doctor what steps will be taken to prevent infection. What happens during the procedure?  An IV tube will be put into one of your veins. You will be given a medicine to help you relax. The skin on your neck or groin will be numbed. A cut (incision) will be made in your neck or groin. A needle will be put through your cut  and into a large vein. A tube (catheter) will be put into the needle. The tube will be moved to your heart. Dye may be put through the tube. This helps your doctor see your heart. Small devices (electrodes) on the tube will send out signals. A type of energy will be used to destroy some heart tissue. The tube will be taken out. Pressure will be held on your cut. This helps stop bleeding. A bandage will be put over your cut. The exact procedure may vary among doctors and hospitals. What happens after the procedure? You will be watched until you leave the hospital or clinic. This includes checking your heart rate, breathing rate, oxygen, and blood pressure. Your cut will be watched for bleeding. You will need to lie still for a few hours. Do not drive for 24 hours or as long as your doctor tells you. Summary Cardiac ablation is a procedure to destroy some heart tissue. This is done to treat heart rhythm problems. Tell your doctor about any medical conditions you may have. Tell him or her about all medicines you are taking to treat them. This is a safe procedure. But problems may occur. These include infection, bruising, bleeding, and damage to nearby areas of your body. Follow what your doctor tells you about food and drink. You may also be told to change or stop some of your medicines. After the procedure, do not drive for 24 hours or as long as your doctor tells you. This information is not intended to replace advice given to you by your health care provider. Make sure you discuss any questions you have with your health care provider. Document Revised: 06/03/2021 Document Reviewed: 02/13/2019 Elsevier Patient Education  2023 [employer]   Cardiac Ablation, Care After  This sheet gives you information about how to care for yourself after your procedure. Your health care provider may also give you more specific instructions. If you have problems or questions, contact your health care  provider. What can I expect after the procedure? After the procedure, it is common to have: Bruising around your puncture site. Tenderness around your puncture site. Skipped heartbeats. If you had an atrial fibrillation ablation, you may have atrial fibrillation during the first several months after your procedure.  Tiredness (fatigue).  Follow these instructions at home: Puncture site care  Follow instructions from your health care provider about how to take care of your puncture site. Make sure you: If present, leave stitches (sutures), skin glue, or adhesive strips in place. These skin closures may need to stay in place for up to 2 weeks. If adhesive strip edges start to loosen and curl up, you may trim the loose edges. Do not remove adhesive strips completely unless your health care provider tells you to do that. If a large square bandage is present, this may be removed 24 hours after surgery.  Check your puncture site every day for signs of infection. Check for: Redness, swelling, or pain. Fluid or blood. If your puncture site starts to bleed, lie down on your back, apply firm pressure to the area, and  contact your health care provider. Warmth. Pus or a bad smell. A pea or small marble sized lump at the site is normal and can take up to three months to resolve.  Driving Do not drive for at least 4 days after your procedure or however long your health care provider recommends. (Do not resume driving if you have previously been instructed not to drive for other health reasons.) Do not drive or use heavy machinery while taking prescription pain medicine. Activity Avoid activities that take a lot of effort for at least 7 days after your procedure. Do not lift anything that is heavier than 5 lb (4.5 kg) for one week.  No sexual activity for 1 week.  Return to your normal activities as told by your health care provider. Ask your health care provider what activities are safe for you. General  instructions Take over-the-counter and prescription medicines only as told by your health care provider. Do not use any products that contain nicotine or tobacco, such as cigarettes and e-cigarettes. If you need help quitting, ask your health care provider. You may shower after 24 hours, but Do not take baths, swim, or use a hot tub for 1 week.  Do not drink alcohol for 24 hours after your procedure. Keep all follow-up visits as told by your health care provider. This is important. Contact a health care provider if: You have redness, mild swelling, or pain around your puncture site. You have fluid or blood coming from your puncture site that stops after applying firm pressure to the area. Your puncture site feels warm to the touch. You have pus or a bad smell coming from your puncture site. You have a fever. You have chest pain or discomfort that spreads to your neck, jaw, or arm. You have chest pain that is worse with lying on your back or taking a deep breath. You are sweating a lot. You feel nauseous. You have a fast or irregular heartbeat. You have shortness of breath. You are dizzy or light-headed and feel the need to lie down. You have pain or numbness in the arm or leg closest to your puncture site. Get help right away if: Your puncture site suddenly swells. Your puncture site is bleeding and the bleeding does not stop after applying firm pressure to the area. These symptoms may represent a serious problem that is an emergency. Do not wait to see if the symptoms will go away. Get medical help right away. Call your local emergency services (911 in the [place] Do not drive yourself to the hospital. Summary After the procedure, it is normal to have bruising and tenderness at the puncture site in your groin, neck, or forearm. Check your puncture site every day for signs of infection. Get help right away if your puncture site is bleeding and the bleeding does not stop after applying firm  pressure to the area. This is a medical emergency. This information is not intended to replace advice given to you by your health care provider. Make sure you discuss any questions you have with your health care provider.

## 2023-12-06 ENCOUNTER — Encounter: Payer: Self-pay | Admitting: Cardiology

## 2023-12-07 ENCOUNTER — Encounter: Payer: Self-pay | Admitting: Cardiology

## 2024-01-08 ENCOUNTER — Ambulatory Visit: Admitting: Cardiology

## 2024-01-08 ENCOUNTER — Ambulatory Visit: Admitting: Internal Medicine

## 2024-01-08 MED ORDER — RIVAROXABAN 20 MG PO TABS: 20.0000 mg | ORAL_TABLET | Freq: Every day | ORAL | 6 refills | Status: AC

## 2024-01-16 ENCOUNTER — Telehealth: Payer: Self-pay

## 2024-01-16 ENCOUNTER — Other Ambulatory Visit: Payer: Self-pay

## 2024-01-16 DIAGNOSIS — I4891 Unspecified atrial fibrillation: Secondary | ICD-10-CM

## 2024-01-16 DIAGNOSIS — Z01812 Encounter for preprocedural laboratory examination: Secondary | ICD-10-CM

## 2024-01-16 NOTE — Telephone Encounter (Signed)
-----   Message from Nurse Sherri P sent at 12/17/2023 12:05 PM EDT ----- Regarding: 02/08/24  AFib ablation Precert:  MD: Camnitz Type of ablation: A-fib Diagnosis: afib CPT code: A-fib (06343) Ablation scheduled (date/time): 11/14 7:30 am  Procedure:  Added to calendar? Yes Orders entered? Yes Letter complete? No, >30 days before procedure Scheduled with cath lab? Yes Any medications to hold? No Labs ordered (CBC, BMET, PT/INR if on warfarin): Yes Mapping system: Doesn't matter CARTO/OPAL rep notified? No Cardiac CT needed? No Dye allergy? N/a Pre-meds ordered and instructions given? N/a Letter method: MyChart H&P: 9/9 Device: No  Follow-up:  Cassie/Angel, please schedule Routine

## 2024-01-18 ENCOUNTER — Telehealth (HOSPITAL_COMMUNITY): Payer: Self-pay

## 2024-01-18 ENCOUNTER — Encounter (HOSPITAL_COMMUNITY): Payer: Self-pay

## 2024-01-18 LAB — CBC

## 2024-01-18 NOTE — Telephone Encounter (Signed)
 Spoke with patient to complete pre-procedure call.     Health status review:  Any new medical conditions, recent signs of acute illness or been started on antibiotics? No Any recent hospitalizations or surgeries? No Any new medications started since pre-op visit? No  Follow all medication instructions prior to procedure or the procedure may be rescheduled:    Continue taking Xarelto (Rivaroxaban) once daily without missing any doses before procedure. Essential chronic medications:  No medication should be continued, unless told otherwise. On the morning of your procedure DO NOT take any medication., including Xarelto (Rivaroxaban).  Nothing to eat or drink after midnight prior to your procedure.  Pre-procedure testing scheduled: CT not needed and reports lab work completed today, October 24.  Confirmed patient is scheduled for Atrial Fibrillation Ablation on Friday, November 14 with Dr. Dr. Inocencio. Instructed patient to arrive at the Main Entrance A at [facility]: [address] [place], [place] [address] and check in at Admitting at 5:30 AM.  Plan to go home the same day, you will only stay overnight if medically necessary.. You MUST have a responsible adult to drive you home and MUST be with you the first 24 hours after you arrive home or your procedure could be cancelled.  Informed patient a nurse will call a day before the procedure to confirm arrival time and ensure instructions are followed.  Patient verbalized understanding to information provided and is agreeable to proceed with procedure.   Advised patient to contact RN Navigator at [phone], to inform of any new medications started after call or concerns prior to procedure.

## 2024-01-19 LAB — BASIC METABOLIC PANEL WITH GFR
BUN/Creatinine Ratio: 28 (ref 12–28)
BUN: 21 mg/dL (ref 8–27)
CO2: 24 mmol/L (ref 20–29)
Calcium: 9.5 mg/dL (ref 8.7–10.3)
Chloride: 103 mmol/L (ref 96–106)
Creatinine, Ser: 0.75 mg/dL (ref 0.57–1.00)
Glucose: 80 mg/dL (ref 70–99)
Potassium: 4.3 mmol/L (ref 3.5–5.2)
Sodium: 142 mmol/L (ref 134–144)
eGFR: 89 mL/min/1.73 (ref >59)

## 2024-01-19 LAB — CBC
Hematocrit: 42.1 % (ref 34.0–46.6)
Hemoglobin: 13.5 g/dL (ref 11.1–15.9)
MCH: 29.5 pg (ref 26.6–33.0)
MCHC: 32.1 g/dL (ref 31.5–35.7)
MCV: 92 fL (ref 79–97)
Platelets: 181 x10E3/uL (ref 150–450)
RBC: 4.57 x10E6/uL (ref 3.77–5.28)
RDW: 12.1 % (ref 11.7–15.4)
WBC: 5.5 x10E3/uL (ref 3.4–10.8)

## 2024-02-07 NOTE — Pre-Procedure Instructions (Signed)
 Instructed patient on the following items: Arrival time 0530 Nothing to eat or drink after midnight No meds AM of procedure Responsible person to drive you home and stay with you for 24 hrs  Have you missed any doses of anti-coagulant Xarelto -  takes once a day,  hasn't missed any doses in last 4 weeks.

## 2024-02-07 NOTE — Anesthesia Preprocedure Evaluation (Signed)
 Anesthesia Evaluation  Patient identified by MRN, date of birth, ID band Patient awake    Reviewed: Allergy & Precautions, H&P , NPO status , Patient's Chart, lab work & pertinent test results  Airway Mallampati: II  TM Distance: >3 FB Neck ROM: Full    Dental no notable dental hx. (+) Teeth Intact, Dental Advisory Given   Pulmonary neg pulmonary ROS   Pulmonary exam normal breath sounds clear to auscultation       Cardiovascular Exercise Tolerance: Good + dysrhythmias Atrial Fibrillation  Rhythm:Irregular Rate:Normal     Neuro/Psych negative neurological ROS  negative psych ROS   GI/Hepatic negative GI ROS, Neg liver ROS,,,  Endo/Other  negative endocrine ROS    Renal/GU negative Renal ROS  negative genitourinary   Musculoskeletal   Abdominal   Peds  Hematology negative hematology ROS (+)   Anesthesia Other Findings   Reproductive/Obstetrics negative OB ROS                              Anesthesia Physical Anesthesia Plan  ASA: 3  Anesthesia Plan: General   Post-op Pain Management: Tylenol PO (pre-op)*   Induction: Intravenous  PONV Risk Score and Plan: 4 or greater and Ondansetron  and Dexamethasone  Airway Management Planned: Oral ETT  Additional Equipment:   Intra-op Plan:   Post-operative Plan: Extubation in OR  Informed Consent: I have reviewed the patients History and Physical, chart, labs and discussed the procedure including the risks, benefits and alternatives for the proposed anesthesia with the patient or authorized representative who has indicated his/her understanding and acceptance.     Dental advisory given  Plan Discussed with: CRNA  Anesthesia Plan Comments:          Anesthesia Quick Evaluation

## 2024-02-08 ENCOUNTER — Encounter (HOSPITAL_COMMUNITY): Admitting: Anesthesiology

## 2024-02-08 ENCOUNTER — Other Ambulatory Visit: Payer: Self-pay

## 2024-02-08 ENCOUNTER — Ambulatory Visit (HOSPITAL_COMMUNITY)
Admission: RE | Admit: 2024-02-08 | Discharge: 2024-02-08 | Disposition: A | Discharge status: Home / Self Care | Attending: Cardiology | Admitting: Cardiology

## 2024-02-08 ENCOUNTER — Ambulatory Visit (HOSPITAL_BASED_OUTPATIENT_CLINIC_OR_DEPARTMENT_OTHER): Admitting: Anesthesiology

## 2024-02-08 ENCOUNTER — Encounter (HOSPITAL_COMMUNITY)
Admission: RE | Disposition: A | Discharge status: Home / Self Care | Payer: Self-pay | Source: Home / Self Care | Attending: Cardiology

## 2024-02-08 DIAGNOSIS — I4891 Unspecified atrial fibrillation: Secondary | ICD-10-CM | POA: Diagnosis not present

## 2024-02-08 DIAGNOSIS — I480 Paroxysmal atrial fibrillation: Secondary | ICD-10-CM

## 2024-02-08 HISTORY — PX: ATRIAL FIBRILLATION ABLATION: EP1191

## 2024-02-08 LAB — POCT ACTIVATED CLOTTING TIME: Activated Clotting Time: 417 s

## 2024-02-08 SURGERY — ATRIAL FIBRILLATION ABLATION
Anesthesia: General

## 2024-02-08 MED ORDER — FENTANYL CITRATE (PF) 100 MCG/2ML IJ SOLN
INTRAMUSCULAR | Status: AC
  Filled 2024-02-08: qty 2

## 2024-02-08 MED ORDER — SODIUM CHLORIDE 0.9% FLUSH: 3.0000 mL | Freq: Two times a day (BID) | INTRAVENOUS | Status: DC

## 2024-02-08 MED ORDER — SODIUM CHLORIDE 0.9 % IV SOLN: INTRAVENOUS | Status: DC | PRN

## 2024-02-08 MED ORDER — SODIUM CHLORIDE 0.9 % IV SOLN: 250.0000 mL | INTRAVENOUS | Status: DC | PRN

## 2024-02-08 MED ORDER — ATROPINE SULFATE 1 MG/10ML IJ SOSY
PREFILLED_SYRINGE | INTRAMUSCULAR | Status: AC
  Filled 2024-02-08: qty 10

## 2024-02-08 MED ORDER — ATROPINE SULFATE 1 MG/10ML IJ SOSY
PREFILLED_SYRINGE | INTRAMUSCULAR | Status: DC | PRN
  Administered 2024-02-08: 1 mg via INTRAVENOUS

## 2024-02-08 MED ORDER — ONDANSETRON HCL 4 MG/2ML IJ SOLN: 4.0000 mg | Freq: Four times a day (QID) | INTRAMUSCULAR | Status: DC | PRN

## 2024-02-08 MED ORDER — MIDAZOLAM HCL (PF) 2 MG/2ML IJ SOLN
INTRAMUSCULAR | Status: DC | PRN
  Administered 2024-02-08: 2 mg via INTRAVENOUS

## 2024-02-08 MED ORDER — PROTAMINE SULFATE 10 MG/ML IV SOLN
INTRAVENOUS | Status: DC | PRN
  Administered 2024-02-08: 40 mg via INTRAVENOUS

## 2024-02-08 MED ORDER — PHENYLEPHRINE HCL-NACL 20-0.9 MG/250ML-% IV SOLN
INTRAVENOUS | Status: DC | PRN
  Administered 2024-02-08: 40 ug/min via INTRAVENOUS

## 2024-02-08 MED ORDER — ACETAMINOPHEN 500 MG PO TABS
1000.0000 mg | ORAL_TABLET | Freq: Once | ORAL | Status: AC
  Administered 2024-02-08: 1000 mg via ORAL
  Filled 2024-02-08: qty 2

## 2024-02-08 MED ORDER — HEPARIN (PORCINE) IN NACL 1000-0.9 UT/500ML-% IV SOLN
INTRAVENOUS | Status: DC | PRN
  Administered 2024-02-08 (×2): 500 mL

## 2024-02-08 MED ORDER — SUGAMMADEX SODIUM 200 MG/2ML IV SOLN
INTRAVENOUS | Status: DC | PRN
  Administered 2024-02-08: 200 mg via INTRAVENOUS

## 2024-02-08 MED ORDER — ROCURONIUM BROMIDE 10 MG/ML (PF) SYRINGE
PREFILLED_SYRINGE | INTRAVENOUS | Status: DC | PRN
Start: 2024-02-08 — End: 2024-02-08
  Administered 2024-02-08: 50 mg via INTRAVENOUS

## 2024-02-08 MED ORDER — HEPARIN SODIUM (PORCINE) 1000 UNIT/ML IJ SOLN
INTRAMUSCULAR | Status: DC | PRN
  Administered 2024-02-08: 14000 [IU] via INTRAVENOUS

## 2024-02-08 MED ORDER — FENTANYL CITRATE (PF) 100 MCG/2ML IJ SOLN
INTRAMUSCULAR | Status: DC | PRN
  Administered 2024-02-08 (×2): 50 ug via INTRAVENOUS

## 2024-02-08 MED ORDER — MIDAZOLAM HCL 2 MG/2ML IJ SOLN
INTRAMUSCULAR | Status: AC
  Filled 2024-02-08: qty 2

## 2024-02-08 MED ORDER — ACETAMINOPHEN 325 MG PO TABS: 650.0000 mg | ORAL_TABLET | ORAL | Status: DC | PRN

## 2024-02-08 MED ORDER — SODIUM CHLORIDE 0.9% FLUSH
3.0000 mL | INTRAVENOUS | Status: DC | PRN
Start: 2024-02-08 — End: 2024-02-08

## 2024-02-08 MED ORDER — PROPOFOL 10 MG/ML IV BOLUS
INTRAVENOUS | Status: DC | PRN
  Administered 2024-02-08: 120 mg via INTRAVENOUS

## 2024-02-08 MED ORDER — SODIUM CHLORIDE 0.9 % IV SOLN: INTRAVENOUS | Status: DC

## 2024-02-08 MED ORDER — ONDANSETRON HCL 4 MG/2ML IJ SOLN
INTRAMUSCULAR | Status: DC | PRN
  Administered 2024-02-08: 4 mg via INTRAVENOUS

## 2024-02-08 MED ORDER — DEXAMETHASONE SOD PHOSPHATE PF 10 MG/ML IJ SOLN
INTRAMUSCULAR | Status: DC | PRN
  Administered 2024-02-08: 10 mg via INTRAVENOUS

## 2024-02-08 SURGICAL SUPPLY — 19 items
BLANKET WARM UNDERBOD FULL ACC (MISCELLANEOUS) ×1 IMPLANT
CABLE FARASTAR GEN2 SNGL USE (CABLE) IMPLANT
CATH ACUNAV GE 8F-90 (CATHETERS) IMPLANT
CATH FARAWAVE NAV 31 (CATHETERS) IMPLANT
CATH WEB BIDIR CS D-F NONAUTO (CATHETERS) IMPLANT
CLOSURE MYNX CONTROL 6F/7F (Vascular Products) IMPLANT
CLOSURE PERCLOSE PROSTYLE (Vascular Products) IMPLANT
COVER SWIFTLINK CONNECTOR (BAG) ×1 IMPLANT
DILATOR VESSEL 38 20CM 16FR (INTRODUCER) IMPLANT
GUIDEWIRE INQWIRE 1.5J.035X260 (WIRE) IMPLANT
KIT PATCH RHYTHMIA HDX (MISCELLANEOUS) IMPLANT
KIT VERSACROSS CNCT FARADRIVE (KITS) IMPLANT
PACK EP LF (CUSTOM PROCEDURE TRAY) ×1 IMPLANT
PAD DEFIB RADIO PHYSIO CONN (PAD) ×1 IMPLANT
SHEATH FARADRIVE STEERABLE (SHEATH) IMPLANT
SHEATH PINNACLE 5F 10CM (SHEATH) IMPLANT
SHEATH PINNACLE 8F 10CM (SHEATH) IMPLANT
SHEATH PINNACLE VASC 9FR (SHEATH) IMPLANT
SHEATH PROBE COVER 6X72 (BAG) IMPLANT

## 2024-02-08 NOTE — Transfer of Care (Signed)
 Immediate Anesthesia Transfer of Care Note  Patient: Megan Conner  Procedure(s) Performed: ATRIAL FIBRILLATION ABLATION  Patient Location: PACU and Cath Lab  Anesthesia Type:General  Level of Consciousness: awake, alert , and oriented  Airway & Oxygen Therapy: Patient Spontanous Breathing, Patient connected to nasal cannula oxygen, and Patient connected to face mask oxygen  Post-op Assessment: Report given to RN and Post -op Vital signs reviewed and stable  Post vital signs: Reviewed and stable  Last Vitals:  Vitals Value Taken Time  BP    Temp 36.4 C 02/08/24 08:49  Pulse 89 02/08/24 08:50  Resp 16 02/08/24 08:50  SpO2 98 % 02/08/24 08:50  Vitals shown include unfiled device data.  Last Pain:  Vitals:   02/08/24 0849  TempSrc: Axillary  PainSc: 0-No pain         Complications: There were no known notable events for this encounter.

## 2024-02-08 NOTE — Anesthesia Postprocedure Evaluation (Signed)
 Anesthesia Post Note  Patient: Megan Conner  Procedure(s) Performed: ATRIAL FIBRILLATION ABLATION     Patient location during evaluation: PACU Anesthesia Type: General Level of consciousness: awake and alert Pain management: pain level controlled Vital Signs Assessment: post-procedure vital signs reviewed and stable Respiratory status: spontaneous breathing, nonlabored ventilation and respiratory function stable Cardiovascular status: blood pressure returned to baseline and stable Postop Assessment: no apparent nausea or vomiting Anesthetic complications: no   There were no known notable events for this encounter.  Last Vitals:  Vitals:   02/08/24 0955 02/08/24 1000  BP: 107/65 112/72  Pulse: 70 67  Resp: 12 14  Temp:    SpO2: 98% 98%    Last Pain:  Vitals:   02/08/24 1035  TempSrc:   PainSc: 0-No pain                 Moncia Annas,W. EDMOND

## 2024-02-08 NOTE — H&P (Signed)
  Electrophysiology Office Note:   Date:  02/08/2024  ID:  Megan Conner, DOB September 25, 1960, MRN [id number]  Primary Cardiologist: Norleen CHRISTELLA Blare, MD Primary Heart Failure: None Electrophysiologist: Farrell Broerman Lunger Norton, MD      History of Present Illness:   Megan Conner is a 63 y.o. female with h/o prediabetes, hyperlipidemia, atrial fibrillation seen today for  for Electrophysiology evaluation of atrial fibrillation at the request of Norleen Blare.    Today, denies symptoms of palpitations, chest pain, dyspnea, orthopnea, PND, lower extremity edema, claudication, dizziness, presyncope, syncope, bleeding, or neurologic sequela. The patient is tolerating medications without difficulties. Plan ablation today.   EP Information / Studies Reviewed:    EKG is ordered today. Personal review as below.        Risk Assessment/Calculations:    CHA2DS2-VASc Score = 1   This indicates a 0.6% annual risk of stroke. The patient's score is based upon: CHF History: 0 HTN History: 0 Diabetes History: 0 Stroke History: 0 Vascular Disease History: 0 Age Score: 0 Gender Score: 1             Physical Exam:   VS:  BP 117/80   Pulse 68   Temp 97.8 F (36.6 C)   Resp 16   Ht 5' 7 (1.702 m)   Wt 74.4 kg   SpO2 97%   BMI 25.69 kg/m    Wt Readings from Last 3 Encounters:  02/08/24 74.4 kg  12/04/23 74.4 kg  07/10/18 82.6 kg    GEN: Well nourished, well developed in no acute distress NECK: No JVD; No carotid bruits CARDIAC: Regular rate and rhythm, no murmurs, rubs, gallops RESPIRATORY:  Clear to auscultation without rales, wheezing or rhonchi  ABDOMEN: Soft, non-tender, non-distended EXTREMITIES:  No edema; No deformity    ASSESSMENT AND PLAN:    1.  Paroxysmal atrial fibrillation: Megan Conner has presented today for surgery, with the diagnosis of AF.  The various methods of treatment have been discussed with the patient and family. After consideration of risks, benefits and other  options for treatment, the patient has consented to  Procedure(s): Catheter ablation as a surgical intervention .  Risks include but not limited to complete heart block, stroke, esophageal damage, nerve damage, bleeding, vascular damage, tamponade, perforation, MI, and death. The patient's history has been reviewed, patient examined, no change in status, stable for surgery.  I have reviewed the patient's chart and labs.  Questions were answered to the patient's satisfaction.    Herschel Fleagle Norton, MD 02/08/2024 7:05 AM

## 2024-02-08 NOTE — Progress Notes (Signed)
 Discharge instructions reviewed with patient, husband and daughter at the bedside. Denies questions or concerns. When patient ambulated to the bathroom. PT developed a small hematoma. PT was returned to the bed pressure was held for 15 minutes. Tenderness resolved, site returned to soft and boggy. PT was placed on flat bedrest for an additional hour. When pt ambulated the second time no s/s of complications at the incision site. PT was escorted from the unit via wheel chair to personal vehicle.

## 2024-02-08 NOTE — Discharge Instructions (Signed)
 Cardiac Ablation, Care After  This sheet gives you information about how to care for yourself after your procedure. Your health care provider may also give you more specific instructions. If you have problems or questions, contact your health care provider. What can I expect after the procedure? After the procedure, it is common to have: Bruising around your puncture site. Tenderness around your puncture site. Skipped heartbeats. If you had an atrial fibrillation ablation, you may have atrial fibrillation during the first several months after your procedure.  Tiredness (fatigue).  Follow these instructions at home: Puncture site care  Follow instructions from your health care provider about how to take care of your puncture site. Make sure you: If present, leave stitches (sutures), skin glue, or adhesive strips in place. These skin closures may need to stay in place for up to 2 weeks. If adhesive strip edges start to loosen and curl up, you may trim the loose edges. Do not remove adhesive strips completely unless your health care provider tells you to do that. If a large square bandage is present, this may be removed 24 hours after surgery.  Check your puncture site every day for signs of infection. Check for: Redness, swelling, or pain. Fluid or blood. If your puncture site starts to bleed, lie down on your back, apply firm pressure to the area, and contact your health care provider. Warmth. Pus or a bad smell. A pea or marble sized lump/knot at the site is normal and can take up to three months to resolve.  Driving Do not drive for at least 4 days after your procedure or however long your health care provider recommends. (Do not resume driving if you have previously been instructed not to drive for other health reasons.) Do not drive or use heavy machinery while taking prescription pain medicine. Activity Avoid activities that take a lot of effort for at least 7 days after your  procedure. Do not lift anything that is heavier than 5 lb (4.5 kg) for one week.  No sexual activity for 1 week.  Return to your normal activities as told by your health care provider. Ask your health care provider what activities are safe for you. General instructions Take over-the-counter and prescription medicines only as told by your health care provider. Do not use any products that contain nicotine or tobacco, such as cigarettes and e-cigarettes. If you need help quitting, ask your health care provider. You may shower after 24 hours, but Do not take baths, swim, or use a hot tub for 1 week.  Do not drink alcohol for 24 hours after your procedure. Keep all follow-up visits as told by your health care provider. This is important. Contact a health care provider if: You have redness, mild swelling, or pain around your puncture site. You have fluid or blood coming from your puncture site that stops after applying firm pressure to the area. Your puncture site feels warm to the touch. You have pus or a bad smell coming from your puncture site. You have a fever. You have chest pain or discomfort that spreads to your neck, jaw, or arm. You have chest pain that is worse with lying on your back or taking a deep breath. You are sweating a lot. You feel nauseous. You have a fast or irregular heartbeat. You have shortness of breath. You are dizzy or light-headed and feel the need to lie down. You have pain or numbness in the arm or leg closest to your puncture site.  Get help right away if: Your puncture site suddenly swells. Your puncture site is bleeding and the bleeding does not stop after applying firm pressure to the area. These symptoms may represent a serious problem that is an emergency. Do not wait to see if the symptoms will go away. Get medical help right away. Call your local emergency services (911 in the [place] Do not drive yourself to the hospital. Summary After the procedure, it  is normal to have bruising and tenderness at the puncture site in your groin, neck, or forearm. Check your puncture site every day for signs of infection. Get help right away if your puncture site is bleeding and the bleeding does not stop after applying firm pressure to the area. This is a medical emergency. This information is not intended to replace advice given to you by your health care provider. Make sure you discuss any questions you have with your health care provider.   Femoral Site Care This sheet gives you information about how to care for yourself after your procedure. Your health care provider may also give you more specific instructions. If you have problems or questions, contact your health care provider. What can I expect after the procedure?  After the procedure, it is common to have: Bruising that usually fades within 1-2 weeks. Tenderness at the site. Follow these instructions at home: Wound care Follow instructions from your health care provider about how to take care of your insertion site. Make sure you: Wash your hands with soap and water before you change your bandage (dressing). If soap and water are not available, use hand sanitizer. Remove your dressing as told by your health care provider. In 24 hours Do not take baths, swim, or use a hot tub until your health care provider approves. You may shower 24-48 hours after the procedure or as told by your health care provider. Gently wash the site with plain soap and water. Pat the area dry with a clean towel. Do not rub the site. This may cause bleeding. Do not apply powder or lotion to the site. Keep the site clean and dry. Check your femoral site every day for signs of infection. Check for: Redness, swelling, or pain. Fluid or blood. Warmth. Pus or a bad smell. Activity For the first 2-3 days after your procedure, or as long as directed: Avoid climbing stairs as much as possible. Do not squat. Do not lift  anything that is heavier than 5 (2.5 kg), or the limit that you are told, until your health care provider says that it is safe. For 7days Rest as directed. Avoid sitting for a long time without moving. Get up to take short walks every 1-2 hours. Do not drive for 4 days if you were given a medicine to help you relax (sedative). General instructions Take over-the-counter and prescription medicines only as told by your health care provider. Keep all follow-up visits as told by your health care provider. This is important. Contact a health care provider if you have: A fever or chills. You have redness, swelling, or pain around your insertion site. Get help right away if: The catheter insertion area swells very fast. You pass out. You suddenly start to sweat or your skin gets clammy. The catheter insertion area is bleeding, and the bleeding does not stop when you hold steady pressure on the area. The area near or just beyond the catheter insertion site becomes pale, cool, tingly, or numb. These symptoms may represent a serious problem  that is an emergency. Do not wait to see if the symptoms will go away. Get medical help right away. Call your local emergency services (911 in the [place] Do not drive yourself to the hospital. Summary After the procedure, it is common to have bruising that usually fades within 1-2 weeks. Check your femoral site every day for signs of infection. Do not lift anything that is heavier than 5 lb (2.5 kg), or the limit that you are told, until your health care provider says that it is safe. This information is not intended to replace advice given to you by your health care provider. Make sure you discuss any questions you have with your health care provider. Document Revised: 03/26/2017 Document Reviewed: 03/26/2017 Elsevier Patient Education  2020 [employer].

## 2024-02-08 NOTE — Anesthesia Procedure Notes (Signed)
 Procedure Name: Intubation Date/Time: 02/08/2024 7:40 AM  Performed by: Obadiah Reyes BROCKS, CRNAPre-anesthesia Checklist: Patient identified, Emergency Drugs available, Suction available and Patient being monitored Patient Re-evaluated:Patient Re-evaluated prior to induction Oxygen Delivery Method: Circle System Utilized Preoxygenation: Pre-oxygenation with 100% oxygen Induction Type: IV induction Ventilation: Mask ventilation without difficulty Laryngoscope Size: Miller and 2 Grade View: Grade I Tube type: Oral Tube size: 7.0 mm Number of attempts: 1 Airway Equipment and Method: Stylet and Oral airway Placement Confirmation: ETT inserted through vocal cords under direct vision, positive ETCO2 and breath sounds checked- equal and bilateral Secured at: 21 cm Tube secured with: Tape Dental Injury: Teeth and Oropharynx as per pre-operative assessment

## 2024-02-09 ENCOUNTER — Encounter (HOSPITAL_COMMUNITY): Payer: Self-pay | Admitting: Cardiology

## 2024-02-11 ENCOUNTER — Telehealth (HOSPITAL_COMMUNITY): Payer: Self-pay

## 2024-02-11 NOTE — Telephone Encounter (Signed)
 Spoke with patient to complete post procedure follow up call.  Patient reports no complications with groin sites.   Instructions reviewed with patient:  It is normal to have bruising, tenderness, mild swelling, and a pea or marble sized lump/knot at the groin site which can take up to three months to resolve.  Get help right away if you notice sudden swelling at the puncture site.  Check your puncture site every day for signs of infection: fever, redness, swelling, pus drainage, warmth, foul odor or excessive pain. If this occurs, please call [phone], to speak with the RN Navigator. Get help right away if your puncture site is bleeding and the bleeding does not stop after applying firm pressure to the area.  You may continue to have skipped beats/ atrial fibrillation during the first several months after your procedure.  It is very important not to miss any doses of your blood thinner Xarelto.    You will follow up with the Afib clinic 4 weeks after your procedure and follow up with the Afib clinic 3 months after your procedure.  Activity restrictions reviewed.  Patient verbalized understanding to all instructions provided.

## 2024-03-07 ENCOUNTER — Encounter (HOSPITAL_COMMUNITY): Payer: Self-pay | Admitting: Internal Medicine

## 2024-03-07 ENCOUNTER — Ambulatory Visit (HOSPITAL_COMMUNITY): Admission: RE | Admit: 2024-03-07 | Discharge: 2024-03-07 | Attending: Internal Medicine | Admitting: Internal Medicine

## 2024-03-07 VITALS — BP 118/82 | HR 72 | Ht 67.0 in | Wt 163.0 lb

## 2024-03-07 DIAGNOSIS — I480 Paroxysmal atrial fibrillation: Secondary | ICD-10-CM

## 2024-03-07 DIAGNOSIS — Z79899 Other long term (current) drug therapy: Secondary | ICD-10-CM | POA: Diagnosis not present

## 2024-03-07 DIAGNOSIS — I4891 Unspecified atrial fibrillation: Secondary | ICD-10-CM | POA: Diagnosis not present

## 2024-03-07 DIAGNOSIS — Z5181 Encounter for therapeutic drug level monitoring: Secondary | ICD-10-CM | POA: Diagnosis not present

## 2024-03-07 NOTE — Progress Notes (Addendum)
 Primary Care Physician: Carlie Bread, FNP Primary Cardiologist: Norleen CHRISTELLA Blare, MD Electrophysiologist: Will Gladis Norton, MD     Referring Physician: Dr. Norton Andres VEAR Gladis is a 63 y.o. female with a history of prediabetes, hyperlipidemia, and paroxysmal atrial fibrillation who presents for consultation in the [facility]. Patient is on Xarelto  for stroke prevention.  On evaluation today, patient is currently in NSR. S/p Afib ablation on 02/08/2024 by Dr. Norton. No episodes of Afib since ablation.  She is taking propafenone  225 mg twice daily.  She monitors her rhythm with watch.  No chest pain or SOB. Leg sites healed without issue. No missed doses of anticoagulant.  Today, she denies symptoms of orthopnea, PND, lower extremity edema, dizziness, presyncope, syncope, snoring, daytime somnolence, bleeding, or neurologic sequela. The patient is tolerating medications without difficulties and is otherwise without complaint today.    she has a BMI of Body mass index is 25.53 kg/m.SABRA Filed Weights   03/07/24 0918  Weight: 73.9 kg    Current Outpatient Medications  Medication Sig Dispense Refill   atorvastatin (LIPITOR) 20 MG tablet Take 20 mg by mouth at bedtime.     loratadine (CLARITIN) 10 MG tablet Take 10 mg by mouth every evening.     propafenone  (RYTHMOL  SR) 225 MG 12 hr capsule Take 1 capsule (225 mg total) by mouth 2 (two) times daily. 60 capsule 6   rivaroxaban  (XARELTO ) 20 MG TABS tablet Take 1 tablet (20 mg total) by mouth daily with supper. 30 tablet 6   Vitamin D, Ergocalciferol, (DRISDOL) 1.25 MG (50000 UNIT) CAPS capsule Take 50,000 Units by mouth every Sunday.     No current facility-administered medications for this encounter.    Atrial Fibrillation Management history:  Previous antiarrhythmic drugs: Propafenone  Previous cardioversions: None Previous ablations: 02/08/2024 Anticoagulation history: Xarelto    ROS- All  systems are reviewed and negative except as per the HPI above.  Physical Exam: BP 118/82   Pulse 72   Ht 5' 7 (1.702 m)   Wt 73.9 kg   BMI 25.53 kg/m   GEN: Well nourished, well developed in no acute distress NECK: No JVD; No carotid bruits CARDIAC: Regular rate and rhythm, no murmurs, rubs, gallops RESPIRATORY:  Clear to auscultation without rales, wheezing or rhonchi  ABDOMEN: Soft, non-tender, non-distended EXTREMITIES:  No edema; No deformity   EKG today demonstrates  EKG Interpretation Date/Time:  Friday March 07 2024 09:21:21 EST Ventricular Rate:  72 PR Interval:  222 QRS Duration:  92 QT Interval:  398 QTC Calculation: 435 R Axis:   85  Text Interpretation: Sinus rhythm with 1st degree A-V block When compared with ECG of 08-Feb-2024 10:00, No significant change was found Confirmed by Terra Pac ([id number]) on 03/07/2024 9:41:06 AM     ASSESSMENT & PLAN CHA2DS2-VASc Score = 1  The patient's score is based upon: CHF History: 0 HTN History: 0 Diabetes History: 0 Stroke History: 0 Vascular Disease History: 0 Age Score: 0 Gender Score: 1       ASSESSMENT AND PLAN: Paroxysmal Atrial Fibrillation (ICD10:  I48.0) The patient's CHA2DS2-VASc score is 1, indicating a 0.6% annual risk of stroke.   S/p A-fib ablation on 02/08/2024 by Dr. Norton.  Patient is currently in NSR.  We had a discussion about what to expect in the recovery period following ablation.  We also discussed paroxysmal episodes of A-fib that may occur during this time.  Reassurance provided to patient.  Continue Xarelto  20 mg daily without interruption in the blanking period.  Anticipate that patient will be able to discontinue antiarrhythmic and anticoagulation (low risk score) at upcoming visit.   High risk medication monitoring (ICD10: [id number]) Patient requires ongoing monitoring for anti-arrhythmic medication which has the potential to cause life threatening arrhythmias or AV block. ECG  intervals are stable. Continue propafenone  225 mg twice daily.      Follow up with A-fib clinic as scheduled.   Terra Pac, [facility]  Afib Clinic [address] [place], [place] [address] [phone]

## 2024-03-07 NOTE — Addendum Note (Signed)
 Encounter addended by: Terra Fairy PARAS, PA-C on: 03/07/2024 9:47 AM  Actions taken: Clinical Note Signed

## 2024-03-12 ENCOUNTER — Other Ambulatory Visit: Payer: Self-pay | Admitting: Nurse Practitioner

## 2024-03-12 ENCOUNTER — Ambulatory Visit
Admission: RE | Admit: 2024-03-12 | Discharge: 2024-03-12 | Disposition: A | Discharge status: Home / Self Care | Source: Ambulatory Visit | Attending: Nurse Practitioner | Admitting: Nurse Practitioner

## 2024-03-12 DIAGNOSIS — Z0000 Encounter for general adult medical examination without abnormal findings: Secondary | ICD-10-CM

## 2024-05-08 ENCOUNTER — Ambulatory Visit (HOSPITAL_COMMUNITY): Admission: RE | Admit: 2024-05-08 | Source: Ambulatory Visit | Admitting: Nurse Practitioner
